# Patient Record
Sex: Male | Born: 1980 | Race: White | Hispanic: No | Marital: Married | Smoking: Never smoker
Health system: Southern US, Community
[De-identification: ages and names within clinical notes are randomized; demographics above are authoritative.]

## PROBLEM LIST (undated history)

## (undated) DIAGNOSIS — K219 Gastro-esophageal reflux disease without esophagitis: Secondary | ICD-10-CM

## (undated) DIAGNOSIS — E039 Hypothyroidism, unspecified: Secondary | ICD-10-CM

## (undated) DIAGNOSIS — E669 Obesity, unspecified: Secondary | ICD-10-CM

## (undated) DIAGNOSIS — T7840XA Allergy, unspecified, initial encounter: Secondary | ICD-10-CM

## (undated) DIAGNOSIS — K760 Fatty (change of) liver, not elsewhere classified: Secondary | ICD-10-CM

## (undated) DIAGNOSIS — E785 Hyperlipidemia, unspecified: Secondary | ICD-10-CM

## (undated) HISTORY — DX: Fatty (change of) liver, not elsewhere classified: K76.0

## (undated) HISTORY — DX: Obesity, unspecified: E66.9

## (undated) HISTORY — DX: Allergy, unspecified, initial encounter: T78.40XA

## (undated) HISTORY — DX: Gastro-esophageal reflux disease without esophagitis: K21.9

## (undated) HISTORY — DX: Hyperlipidemia, unspecified: E78.5

## (undated) HISTORY — PX: WISDOM TOOTH EXTRACTION: SHX21

## (undated) HISTORY — DX: Hypothyroidism, unspecified: E03.9

---

## 1999-05-11 ENCOUNTER — Encounter: Payer: Self-pay | Admitting: Family Medicine

## 2005-05-26 ENCOUNTER — Encounter: Payer: Self-pay | Admitting: Family Medicine

## 2005-05-26 LAB — CONVERTED CEMR LAB: RBC count: 4.69 10*6/uL

## 2005-05-27 ENCOUNTER — Encounter: Payer: Self-pay | Admitting: Family Medicine

## 2005-07-27 ENCOUNTER — Ambulatory Visit: Payer: Self-pay | Admitting: Family Medicine

## 2005-08-25 ENCOUNTER — Ambulatory Visit: Payer: Self-pay | Admitting: Family Medicine

## 2005-08-25 LAB — CONVERTED CEMR LAB: TSH: 25.76 microintl units/mL

## 2005-09-01 ENCOUNTER — Ambulatory Visit: Payer: Self-pay | Admitting: Family Medicine

## 2005-10-26 ENCOUNTER — Ambulatory Visit: Payer: Self-pay | Admitting: Family Medicine

## 2005-10-27 ENCOUNTER — Ambulatory Visit: Payer: Self-pay | Admitting: Family Medicine

## 2005-10-27 LAB — CONVERTED CEMR LAB: TSH: 1.07 microintl units/mL

## 2006-03-02 ENCOUNTER — Ambulatory Visit: Payer: Self-pay | Admitting: Family Medicine

## 2006-06-28 ENCOUNTER — Ambulatory Visit: Payer: Self-pay | Admitting: Family Medicine

## 2006-06-28 LAB — CONVERTED CEMR LAB
Hgb A1c MFr Bld: 5.2 %
TSH: 0.34 microintl units/mL

## 2006-07-01 ENCOUNTER — Ambulatory Visit: Payer: Self-pay | Admitting: Family Medicine

## 2006-11-03 ENCOUNTER — Ambulatory Visit: Payer: Self-pay | Admitting: Family Medicine

## 2006-11-03 LAB — CONVERTED CEMR LAB
Albumin: 3.9 g/dL (ref 3.5–5.2)
Bilirubin, Direct: 0.2 mg/dL (ref 0.0–0.3)
Blood Glucose, Fasting: 102 mg/dL
Cholesterol: 97 mg/dL (ref 0–200)
Creatinine, Ser: 1.1 mg/dL (ref 0.4–1.5)
Free T4: 0.9 ng/dL (ref 0.6–1.6)
Glucose, Bld: 102 mg/dL — ABNORMAL HIGH (ref 70–99)
HDL: 28.5 mg/dL — ABNORMAL LOW (ref >39.0)
LDL Cholesterol: 48 mg/dL (ref 0–99)
Potassium: 3.9 meq/L (ref 3.5–5.1)
Sodium: 142 meq/L (ref 135–145)
TSH: 1.22 microintl units/mL
TSH: 1.22 microintl units/mL (ref 0.35–5.50)
Total Bilirubin: 0.5 mg/dL (ref 0.3–1.2)
Triglycerides: 104 mg/dL (ref 0–149)
VLDL: 21 mg/dL (ref 0–40)

## 2006-11-08 ENCOUNTER — Ambulatory Visit: Payer: Self-pay | Admitting: Family Medicine

## 2006-11-16 ENCOUNTER — Ambulatory Visit: Payer: Self-pay | Admitting: Family Medicine

## 2006-11-22 ENCOUNTER — Ambulatory Visit: Payer: Self-pay | Admitting: Family Medicine

## 2007-05-27 ENCOUNTER — Encounter: Payer: Self-pay | Admitting: Family Medicine

## 2007-05-27 LAB — CONVERTED CEMR LAB: TSH: 150 microintl units/mL

## 2008-02-07 ENCOUNTER — Encounter: Payer: Self-pay | Admitting: Family Medicine

## 2008-02-07 ENCOUNTER — Ambulatory Visit: Payer: Self-pay | Admitting: Family Medicine

## 2008-02-07 DIAGNOSIS — R7303 Prediabetes: Secondary | ICD-10-CM | POA: Insufficient documentation

## 2008-02-07 DIAGNOSIS — E039 Hypothyroidism, unspecified: Secondary | ICD-10-CM | POA: Insufficient documentation

## 2008-02-08 DIAGNOSIS — E785 Hyperlipidemia, unspecified: Secondary | ICD-10-CM | POA: Insufficient documentation

## 2008-02-12 ENCOUNTER — Encounter: Payer: Self-pay | Admitting: Family Medicine

## 2008-05-02 ENCOUNTER — Telehealth: Payer: Self-pay | Admitting: Family Medicine

## 2010-05-12 ENCOUNTER — Encounter (INDEPENDENT_AMBULATORY_CARE_PROVIDER_SITE_OTHER): Payer: Self-pay | Admitting: *Deleted

## 2010-11-03 NOTE — Letter (Signed)
Summary: Nadara Eaton letter  [place] [facility]  [address] [place], [place] [address]   Phone: [phone]  Fax: [phone]       05/12/2010 MRN: [id number]  Antonio Doyle [address] [place], [place]  [address]  Dear Mr. Antonio Doyle,  [facility] [place], and [facility] announce the retirement of Antonio Doyle, M.D., from full-time practice at the [facility] office effective April 02, 2010 and his plans of returning part-time.  It is important to Dr. Hetty Doyle and to our practice that you understand that [facility] has seven physicians in our office for your health care needs.  We will continue to offer the same exceptional care that you have today.    Dr. Hetty Doyle has spoken to many of you about his plans for retirement and returning part-time in the fall.   We will continue to work with you through the transition to schedule appointments for you in the office and meet the high standards that [facility] is committed to.   Again, it is with great pleasure that we share the news that Dr. Hetty Doyle will return to [facility] in October of 2011 with a reduced schedule.    If you have any questions, or would like to request an appointment with one of our physicians, please call us at [phone] and press the option for Scheduling an appointment.  We take pleasure in providing you with excellent patient care and look forward to seeing you at your next office visit.  Our [facility] Physicians are:  Antonio Doyle, M.D. Antonio Doyle, M.D. Antonio Doyle, M.D. Antonio Doyle, M.D. Antonio Doyle, M.D. Antonio Doyle, M.D. We proudly welcomed Antonio Doyle, M.D. and Antonio Doyle, M.D. to the practice in July/August 2011.  Sincerely,  [facility]

## 2012-10-04 DIAGNOSIS — K760 Fatty (change of) liver, not elsewhere classified: Secondary | ICD-10-CM

## 2012-10-04 HISTORY — DX: Fatty (change of) liver, not elsewhere classified: K76.0

## 2012-10-11 ENCOUNTER — Ambulatory Visit (INDEPENDENT_AMBULATORY_CARE_PROVIDER_SITE_OTHER): Payer: BC Managed Care – PPO | Admitting: Family Medicine

## 2012-10-11 ENCOUNTER — Encounter: Payer: Self-pay | Admitting: Family Medicine

## 2012-10-11 VITALS — BP 118/86 | HR 80 | Temp 98.2°F | Ht 70.0 in | Wt 274.8 lb

## 2012-10-11 DIAGNOSIS — E039 Hypothyroidism, unspecified: Secondary | ICD-10-CM

## 2012-10-11 DIAGNOSIS — E669 Obesity, unspecified: Secondary | ICD-10-CM

## 2012-10-11 DIAGNOSIS — Z0000 Encounter for general adult medical examination without abnormal findings: Secondary | ICD-10-CM | POA: Insufficient documentation

## 2012-10-11 MED ORDER — LEVOTHYROXINE SODIUM 125 MCG PO TABS: 125.0000 ug | ORAL_TABLET | Freq: Every day | ORAL | Status: DC

## 2012-10-11 NOTE — Patient Instructions (Addendum)
I've refilled levothyroxine. Return in 1-2 months fasting for lab visit and we will check thyroid function as well as sugar and chol levels. Return in 1 year for next physical or as needed. Work on increasing activity - especially aerobic exercise (cardio).

## 2012-10-11 NOTE — Assessment & Plan Note (Signed)
Refill levothyroxine, 90mo supply per pt preference.

## 2012-10-11 NOTE — Assessment & Plan Note (Signed)
Discussed elevated bmi as well as lifestyle changes to affect weight loss. Wt Readings from Last 3 Encounters:  10/11/12 274 lb 12 oz (124.626 kg)  02/07/08 231 lb (104.781 kg)  07/27/05 232 lb (105.235 kg)   Body mass index is 39.42 kg/(m^2).

## 2012-10-11 NOTE — Progress Notes (Signed)
  Subjective:    Patient ID: Antonio Doyle, male    DOB: 11-Nov-1980, 32 y.o.   MRN: [id number]  HPI CC: new pt to re establish  Prior saw Dr. Glenis Smoker, not satisfied with wait time.  Hypothyroid - takes synthroid generic.  Ran out a few days ago.    Caffeine: mt dew Lives with wife and 2 daughters, 2 dogs Occupation: Psychiatrist Edu: college Activity: trying to go to fitness center a few days a week Diet: good water, fruits/vegetables daily  Preventative: No recent CPE.  Last blood work was 1 year ago. Td - a few years since last one. Declines flu today.  Seat belt use discussed. Sunscreen use discussed.  Review of Systems  Constitutional: Negative for fever, chills, activity change, appetite change, fatigue and unexpected weight change.  HENT: Negative for hearing loss and neck pain.   Eyes: Negative for visual disturbance.  Respiratory: Negative for cough, chest tightness, shortness of breath and wheezing.   Cardiovascular: Negative for chest pain, palpitations and leg swelling.  Gastrointestinal: Negative for nausea, vomiting, abdominal pain, diarrhea, constipation, blood in stool and abdominal distention.  Genitourinary: Negative for hematuria and difficulty urinating.  Musculoskeletal: Negative for myalgias and arthralgias.  Skin: Negative for rash.  Neurological: Positive for headaches (sinus). Negative for dizziness, seizures and syncope.  Hematological: Does not bruise/bleed easily.  Psychiatric/Behavioral: Negative for dysphoric mood. The patient is not nervous/anxious.        Objective:   Physical Exam  Nursing note and vitals reviewed. Constitutional: He is oriented to person, place, and time. He appears well-developed and well-nourished. No distress.  HENT:  Head: Normocephalic and atraumatic.  Right Ear: External ear normal.  Left Ear: External ear normal.  Nose: Nose normal.  Mouth/Throat: Oropharynx is clear and moist. No  oropharyngeal exudate.  Eyes: Conjunctivae normal and EOM are normal. Pupils are equal, round, and reactive to light. No scleral icterus.  Neck: Normal range of motion. Neck supple. No thyromegaly present.  Cardiovascular: Normal rate, regular rhythm, normal heart sounds and intact distal pulses.   No murmur heard. Pulses:      Radial pulses are 2+ on the right side, and 2+ on the left side.  Pulmonary/Chest: Effort normal and breath sounds normal. No respiratory distress. He has no wheezes. He has no rales.  Abdominal: Soft. Bowel sounds are normal. He exhibits no distension and no mass. There is no tenderness. There is no rebound and no guarding.  Musculoskeletal: Normal range of motion. He exhibits no edema.  Lymphadenopathy:    He has no cervical adenopathy.  Neurological: He is alert and oriented to person, place, and time.       CN grossly intact, station and gait intact  Skin: Skin is warm and dry. No rash noted.  Psychiatric: He has a normal mood and affect. His behavior is normal. Judgment and thought content normal.       Assessment & Plan:

## 2012-10-11 NOTE — Assessment & Plan Note (Signed)
Preventative protocols reviewed and updated unless pt declined. Discussed healthy diet and lifestyle. We don't have egg-free flu shot.

## 2012-11-10 ENCOUNTER — Other Ambulatory Visit (INDEPENDENT_AMBULATORY_CARE_PROVIDER_SITE_OTHER): Payer: BC Managed Care – PPO

## 2012-11-10 DIAGNOSIS — Z0000 Encounter for general adult medical examination without abnormal findings: Secondary | ICD-10-CM

## 2012-11-10 DIAGNOSIS — E039 Hypothyroidism, unspecified: Secondary | ICD-10-CM

## 2012-11-10 DIAGNOSIS — E669 Obesity, unspecified: Secondary | ICD-10-CM

## 2012-11-10 LAB — COMPREHENSIVE METABOLIC PANEL
Albumin: 4.5 g/dL (ref 3.5–5.2)
CO2: 28 mEq/L (ref 19–32)
Calcium: 9 mg/dL (ref 8.4–10.5)
GFR: 81.43 mL/min (ref >60.00)
Glucose, Bld: 108 mg/dL — ABNORMAL HIGH (ref 70–99)
Potassium: 3.9 mEq/L (ref 3.5–5.1)
Sodium: 141 mEq/L (ref 135–145)
Total Protein: 7.5 g/dL (ref 6.0–8.3)

## 2012-11-10 LAB — TSH: TSH: 6.63 u[IU]/mL — ABNORMAL HIGH (ref 0.35–5.50)

## 2012-11-10 LAB — LIPID PANEL: Cholesterol: 109 mg/dL (ref 0–200)

## 2012-11-12 ENCOUNTER — Encounter: Payer: Self-pay | Admitting: Family Medicine

## 2012-11-12 ENCOUNTER — Other Ambulatory Visit: Payer: Self-pay | Admitting: Family Medicine

## 2012-11-12 DIAGNOSIS — R7401 Elevation of levels of liver transaminase levels: Secondary | ICD-10-CM

## 2012-11-12 DIAGNOSIS — R739 Hyperglycemia, unspecified: Secondary | ICD-10-CM | POA: Insufficient documentation

## 2012-11-12 DIAGNOSIS — R7309 Other abnormal glucose: Secondary | ICD-10-CM

## 2012-11-12 DIAGNOSIS — E039 Hypothyroidism, unspecified: Secondary | ICD-10-CM

## 2012-11-12 DIAGNOSIS — K7581 Nonalcoholic steatohepatitis (NASH): Secondary | ICD-10-CM | POA: Insufficient documentation

## 2012-11-12 MED ORDER — LEVOTHYROXINE SODIUM 137 MCG PO TABS: 137.0000 ug | ORAL_TABLET | Freq: Every day | ORAL | Status: DC

## 2013-01-16 ENCOUNTER — Other Ambulatory Visit (INDEPENDENT_AMBULATORY_CARE_PROVIDER_SITE_OTHER): Payer: BC Managed Care – PPO

## 2013-01-16 DIAGNOSIS — R7309 Other abnormal glucose: Secondary | ICD-10-CM

## 2013-01-16 DIAGNOSIS — E786 Lipoprotein deficiency: Secondary | ICD-10-CM

## 2013-01-16 DIAGNOSIS — R7402 Elevation of levels of lactic acid dehydrogenase (LDH): Secondary | ICD-10-CM

## 2013-01-16 DIAGNOSIS — E039 Hypothyroidism, unspecified: Secondary | ICD-10-CM

## 2013-01-16 DIAGNOSIS — R7401 Elevation of levels of liver transaminase levels: Secondary | ICD-10-CM

## 2013-01-16 LAB — HEPATIC FUNCTION PANEL
ALT: 105 U/L — ABNORMAL HIGH (ref 0–53)
AST: 40 U/L — ABNORMAL HIGH (ref 0–37)
Albumin: 4.3 g/dL (ref 3.5–5.2)
Alkaline Phosphatase: 73 U/L (ref 39–117)
Bilirubin, Direct: 0.1 mg/dL (ref 0.0–0.3)
Total Protein: 7.2 g/dL (ref 6.0–8.3)

## 2013-01-16 LAB — TSH: TSH: 0.37 u[IU]/mL (ref 0.35–5.50)

## 2013-01-22 ENCOUNTER — Other Ambulatory Visit: Payer: Self-pay | Admitting: Family Medicine

## 2013-01-22 DIAGNOSIS — R7401 Elevation of levels of liver transaminase levels: Secondary | ICD-10-CM

## 2013-01-31 ENCOUNTER — Encounter: Payer: Self-pay | Admitting: Family Medicine

## 2013-01-31 ENCOUNTER — Ambulatory Visit: Payer: Self-pay | Admitting: Family Medicine

## 2013-02-04 ENCOUNTER — Other Ambulatory Visit: Payer: Self-pay | Admitting: Family Medicine

## 2013-02-04 DIAGNOSIS — R162 Hepatomegaly with splenomegaly, not elsewhere classified: Secondary | ICD-10-CM

## 2013-02-04 DIAGNOSIS — E039 Hypothyroidism, unspecified: Secondary | ICD-10-CM

## 2013-02-04 DIAGNOSIS — R7401 Elevation of levels of liver transaminase levels: Secondary | ICD-10-CM

## 2013-02-06 ENCOUNTER — Telehealth: Payer: Self-pay

## 2013-02-06 DIAGNOSIS — R7401 Elevation of levels of liver transaminase levels: Secondary | ICD-10-CM

## 2013-02-06 NOTE — Telephone Encounter (Signed)
Ok to refer to GI per patient request - I've placed referral in chart. However, I'd like him to come in for further blood work evaluation prior to GI appointment - may come in this week.  Orders in chart.

## 2013-02-06 NOTE — Telephone Encounter (Signed)
Message left advising patient to call and schedule lab appt and that referral would be made.

## 2013-02-06 NOTE — Telephone Encounter (Signed)
Pt s wife left v/m pt is requesting referral to [facility] [phone] due to LFT elevated and enlarged liver; pt does not want to wait for further testing.Please advise.

## 2013-02-07 ENCOUNTER — Encounter: Payer: Self-pay | Admitting: Internal Medicine

## 2013-02-07 ENCOUNTER — Other Ambulatory Visit (INDEPENDENT_AMBULATORY_CARE_PROVIDER_SITE_OTHER): Payer: BC Managed Care – PPO

## 2013-02-07 DIAGNOSIS — R7402 Elevation of levels of lactic acid dehydrogenase (LDH): Secondary | ICD-10-CM

## 2013-02-07 DIAGNOSIS — E039 Hypothyroidism, unspecified: Secondary | ICD-10-CM

## 2013-02-07 DIAGNOSIS — R162 Hepatomegaly with splenomegaly, not elsewhere classified: Secondary | ICD-10-CM

## 2013-02-07 DIAGNOSIS — K7689 Other specified diseases of liver: Secondary | ICD-10-CM

## 2013-02-07 DIAGNOSIS — R7401 Elevation of levels of liver transaminase levels: Secondary | ICD-10-CM

## 2013-02-07 LAB — PROTIME-INR
INR: 1.1 ratio — ABNORMAL HIGH (ref 0.8–1.0)
Prothrombin Time: 11.2 s (ref 10.2–12.4)

## 2013-02-07 LAB — GAMMA GT: GGT: 66 U/L — ABNORMAL HIGH (ref 7–51)

## 2013-02-07 LAB — TSH: TSH: 0.78 u[IU]/mL (ref 0.35–5.50)

## 2013-02-07 NOTE — Telephone Encounter (Signed)
Patient changed his mind and wanted to go to [facility] instead of [facility].

## 2013-02-08 LAB — ANA: Anti Nuclear Antibody(ANA): NEGATIVE

## 2013-02-09 LAB — PROTEIN ELECTROPHORESIS, SERUM, WITH REFLEX
Alpha-2-Globulin: 9.3 % (ref 7.1–11.8)
Gamma Globulin: 17.1 % (ref 11.1–18.8)
Total Protein, Serum Electrophoresis: 7.1 g/dL (ref 6.0–8.3)

## 2013-02-09 LAB — CERULOPLASMIN: Ceruloplasmin: 24 mg/dL (ref 20–60)

## 2013-03-01 ENCOUNTER — Encounter: Payer: Self-pay | Admitting: Internal Medicine

## 2013-03-05 ENCOUNTER — Ambulatory Visit (INDEPENDENT_AMBULATORY_CARE_PROVIDER_SITE_OTHER): Payer: BC Managed Care – PPO | Admitting: Internal Medicine

## 2013-03-05 ENCOUNTER — Encounter: Payer: Self-pay | Admitting: Internal Medicine

## 2013-03-05 ENCOUNTER — Other Ambulatory Visit (INDEPENDENT_AMBULATORY_CARE_PROVIDER_SITE_OTHER): Payer: BC Managed Care – PPO

## 2013-03-05 VITALS — BP 118/80 | HR 80 | Ht 70.0 in | Wt 266.0 lb

## 2013-03-05 DIAGNOSIS — K625 Hemorrhage of anus and rectum: Secondary | ICD-10-CM

## 2013-03-05 DIAGNOSIS — K7689 Other specified diseases of liver: Secondary | ICD-10-CM

## 2013-03-05 DIAGNOSIS — R162 Hepatomegaly with splenomegaly, not elsewhere classified: Secondary | ICD-10-CM

## 2013-03-05 DIAGNOSIS — R61 Generalized hyperhidrosis: Secondary | ICD-10-CM

## 2013-03-05 DIAGNOSIS — R7989 Other specified abnormal findings of blood chemistry: Secondary | ICD-10-CM

## 2013-03-05 LAB — HEPATIC FUNCTION PANEL
ALT: 83 U/L — ABNORMAL HIGH (ref 0–53)
AST: 33 U/L (ref 0–37)
Alkaline Phosphatase: 77 U/L (ref 39–117)
Bilirubin, Direct: 0.1 mg/dL (ref 0.0–0.3)
Total Bilirubin: 0.8 mg/dL (ref 0.3–1.2)

## 2013-03-05 LAB — IGA: IgA: 120 mg/dL (ref 68–378)

## 2013-03-05 LAB — HEPATITIS A ANTIBODY, TOTAL: Hep A Total Ab: NEGATIVE

## 2013-03-05 NOTE — Progress Notes (Signed)
Patient ID: Antonio Doyle, male   DOB: Sep 27, 1981, 32 y.o.   MRN: [id number] HPI: Antonio Doyle is a 32 yo male with PMH of hypothyroidism, obesity, hyperglycemia without diagnosis of diabetes who is seen in consultation at request of Dr. Sharen Hones for evaluation of elevated liver function tests and hepatosplenomegaly by ultrasound. He is here today with his wife. The patient denies any family history or personal history of liver disease. He denies jaundice, ascites, significant lower extremity edema, bleeding or itching. He does have mild/trace ankle edema at the end of the day which resolves by morning. He denies abdominal pain, heartburn, nausea or vomiting. He reports a good appetite. He reports significantly low her energy levels over the last 4-6 months. His wife feels that he "gets sick easily" and has had multiple "colds" over the last 6 months. He reports poor sleep as well as intermittent night sweats and possible fevers. He has not measured his temperatures at home. He denies rashes. No known tick bites. He does report scant red blood with wiping and occasional loose stool over the last "1-2 months". This is painless in nature and small volume. He denies diarrhea or constipation. He does not drink alcohol. No illicit drug use. No over-the-counter supplements or medications  He reports his paternal grandfather had alcoholic cirrhosis and also colon cancer. This was an advanced age but he is unsure what age.  Patient Active Problem List   Diagnosis Date Noted  . Transaminitis 11/12/2012  . Healthcare maintenance 10/11/2012  . Obesity   . LOW HDL 02/08/2008  . HYPOTHYROIDISM 02/07/2008  . HYPERGLYCEMIA 02/07/2008    Past Surgical History  Procedure Laterality Date  . Wisdom tooth extraction      x 3    Current Outpatient Prescriptions  Medication Sig Dispense Refill  . levothyroxine (SYNTHROID, LEVOTHROID) 137 MCG tablet Take 1 tablet (137 mcg total) by mouth daily.  90 tablet  3    No current facility-administered medications for this visit.    Allergies  Allergen Reactions  . Eggs Or Egg-Derived Products Anaphylaxis    Family History  Problem Relation Age of Onset  . Adopted: Yes  . Pancreatic cancer Maternal Grandfather   . Colon cancer Paternal Grandfather     colon  . Stroke Father     smoker  . Diabetes Neg Hx   . Hypertension Neg Hx   . CAD Neg Hx   . Colon polyps Neg Hx     History  Substance Use Topics  . Smoking status: Never Smoker   . Smokeless tobacco: Never Used  . Alcohol Use: No    ROS: As per history of present illness, otherwise negative  BP 118/80  Pulse 80  Ht 5\' 10"  (1.778 m)  Wt 266 lb (120.657 kg)  BMI 38.17 kg/m2 Constitutional: Well-developed and well-nourished. No distress. HEENT: Normocephalic and atraumatic. Oropharynx is clear and moist. No oropharyngeal exudate. Conjunctivae are normal.  No scleral icterus. Neck: Neck supple. Trachea midline. Cardiovascular: Normal rate, regular rhythm and intact distal pulses. No M/R/G Pulmonary/chest: Effort normal and breath sounds normal. No wheezing, rales or rhonchi. Abdominal: Soft, nontender, nondistended. Bowel sounds active throughout. There are no masses palpable. No hepatosplenomegaly. Extremities: no clubbing, cyanosis, or edema Lymphadenopathy: No cervical adenopathy noted. Neurological: Alert and oriented to person place and time. Skin: Skin is warm and dry. No rashes noted. Psychiatric: Normal mood and affect. Behavior is normal.  RELEVANT LABS AND IMAGING:  CMP     Component Value  Date/Time   NA 141 11/10/2012 0855   K 3.9 11/10/2012 0855   CL 108 11/10/2012 0855   CO2 28 11/10/2012 0855   GLUCOSE 108* 11/10/2012 0855   BUN 13 11/10/2012 0855   CREATININE 1.1 11/10/2012 0855   CALCIUM 9.0 11/10/2012 0855   PROT 7.2 01/16/2013 0900   ALBUMIN 4.3 01/16/2013 0900   AST 40* 01/16/2013 0900   ALT 105* 01/16/2013 0900   ALKPHOS 73 01/16/2013 0900   BILITOT 0.4 01/16/2013  0900   GFRNONAA 85 11/03/2006 1010   GFRAA 103 11/03/2006 1010   Iron/TIBC/Ferritin    Component Value Date/Time   IRON 61 01/16/2013 0900   ANA - neg ASMA - neg Ceruloplasmin - normal TSH - normal Acute Hep panel neg Liver kidney microsome - neg  Complete abd [place],  study reviewed,  - 01/31/13 - mild hepatosplenomegaly otherwise unremarkable  ASSESSMENT/PLAN: 32 yo male with PMH of hypothyroidism, obesity, hyperglycemia without diagnosis of diabetes who is seen in consultation at request of Dr. Sharen Hones for evaluation of elevated liver function tests and hepatosplenomegaly by ultrasound, also with intermittent rectal bleeding and B symptoms including fatigue, night sweats, and possible fever  1.  Elevated serum transaminases/mild hepatosplenomegaly/B symptoms -- the laboratory evaluation to date has showed no reason for elevated serum transaminases. He would be at risk for nonalcoholic fatty liver disease, but ultrasound showed no definite steatosis. At this point given his B. symptoms and mild hepatosplenomegaly ultrasound, I would like to perform cross-sectional imaging of the abdomen and pelvis. MRI with contrast will be ordered. I would also like to perform additional labs to include ANA, hepatitis B. surface antibody and core antibody total, hep A total antibody, alpha-1 antitrypsin and a celiac panel. I will repeat hepatic function panel today. I asked that he measure his temperature when he feels that he has a fever and keep these recorded. We did briefly discuss liver biopsy, but I would like to perform the previously mentioned tests 1st.  I will see him back in 6 weeks, sooner if necessary  2.  Rectal bleeding -- I recommended flexible sigmoidoscopy for further characterization and diagnosis of his rectal bleeding. He would like to think about this, and we will discuss this in followup.

## 2013-03-05 NOTE — Patient Instructions (Addendum)
Your physician has requested that you go to the basement for lab work before leaving today.  You have been scheduled for an MRI at [facility] on 03/12/2013. Your appointment time is 12:00pm. Please arrive 15 minutes prior to your appointment time for registration purposes. There is no prep for this test. However, if you have any metal in your body, have a pacemaker or defibrillator, please be sure to let your ordering physician know. This test typically takes 45 minutes to 1 hour to complete. Nothing to eat or drink 4 hours prior to scan.  Dr. Rhea Belton recommends you get a flexible sigmoidoscopy, when you choose to have this procedure please call us at [phone]                                               We are excited to introduce MyChart, a new best-in-class service that provides you online access to important information in your electronic medical record. We want to make it easier for you to view your health information - all in one secure location - when and where you need it. We expect MyChart will enhance the quality of care and service we provide.  When you register for MyChart, you can:    View your test results.    Request appointments and receive appointment reminders via email.    Request medication renewals.    View your medical history, allergies, medications and immunizations.    Communicate with your physician's office through a password-protected site.    Conveniently print information such as your medication lists.  To find out if MyChart is right for you, please talk to a member of our clinical staff today. We will gladly answer your questions about this free health and wellness tool.  If you are age 34 or older and want a member of your family to have access to your record, you must provide written consent by completing a proxy form available at our office. Please speak to our clinical staff about guidelines regarding accounts for patients younger than age  34.  As you activate your MyChart account and need any technical assistance, please call the MyChart technical support line at [phone]) [phone]-CHART [phone]) or email your question to [email or web address]. If you email your question(s), please include your name, a return phone number and the best time to reach you.  If you have non-urgent health-related questions, you can send a message to our office through MyChart at [email or web address]. If you have a medical emergency, call 911.  Thank you for using MyChart as your new health and wellness resource!   MyChart licensed from [employer],  [id number]. Patents Pending.

## 2013-03-06 LAB — ANTI-SMOOTH MUSCLE ANTIBODY, IGG: Smooth Muscle Ab: 9 U

## 2013-03-06 LAB — TISSUE TRANSGLUTAMINASE, IGA: Tissue Transglutaminase Ab, IgA: 3 U/mL

## 2013-03-12 ENCOUNTER — Other Ambulatory Visit: Payer: Self-pay | Admitting: Internal Medicine

## 2013-03-12 ENCOUNTER — Ambulatory Visit (HOSPITAL_COMMUNITY): Payer: BC Managed Care – PPO

## 2013-03-12 ENCOUNTER — Ambulatory Visit (HOSPITAL_COMMUNITY)
Admission: RE | Admit: 2013-03-12 | Discharge: 2013-03-12 | Disposition: A | Discharge status: Home / Self Care | Payer: BC Managed Care – PPO | Source: Ambulatory Visit | Attending: Internal Medicine | Admitting: Internal Medicine

## 2013-03-12 ENCOUNTER — Ambulatory Visit (HOSPITAL_COMMUNITY): Admission: RE | Admit: 2013-03-12 | Payer: BC Managed Care – PPO | Source: Ambulatory Visit

## 2013-03-12 ENCOUNTER — Telehealth: Payer: Self-pay | Admitting: Internal Medicine

## 2013-03-12 DIAGNOSIS — R162 Hepatomegaly with splenomegaly, not elsewhere classified: Secondary | ICD-10-CM

## 2013-03-12 DIAGNOSIS — R7989 Other specified abnormal findings of blood chemistry: Secondary | ICD-10-CM

## 2013-03-12 DIAGNOSIS — R61 Generalized hyperhidrosis: Secondary | ICD-10-CM

## 2013-03-12 DIAGNOSIS — R161 Splenomegaly, not elsewhere classified: Secondary | ICD-10-CM

## 2013-03-12 DIAGNOSIS — R160 Hepatomegaly, not elsewhere classified: Secondary | ICD-10-CM

## 2013-03-12 MED ORDER — GADOBENATE DIMEGLUMINE 529 MG/ML IV SOLN
20.0000 mL | Freq: Once | INTRAVENOUS | Status: AC | PRN
  Administered 2013-03-12: 20 mL via INTRAVENOUS

## 2013-03-13 NOTE — Telephone Encounter (Signed)
Spoke with wife who though it best to have an Open MRI. Informed her of appt at GI at [address] on 03/22/13, arrive 2:30PM and NPO  4 hours prior. Wife stated understanding.

## 2013-03-22 ENCOUNTER — Ambulatory Visit
Admission: RE | Admit: 2013-03-22 | Discharge: 2013-03-22 | Disposition: A | Discharge status: Home / Self Care | Payer: BC Managed Care – PPO | Source: Ambulatory Visit | Attending: Internal Medicine | Admitting: Internal Medicine

## 2013-03-22 DIAGNOSIS — R7989 Other specified abnormal findings of blood chemistry: Secondary | ICD-10-CM

## 2013-03-22 DIAGNOSIS — R160 Hepatomegaly, not elsewhere classified: Secondary | ICD-10-CM

## 2013-03-22 DIAGNOSIS — R161 Splenomegaly, not elsewhere classified: Secondary | ICD-10-CM

## 2013-03-22 MED ORDER — GADOBENATE DIMEGLUMINE 529 MG/ML IV SOLN
20.0000 mL | Freq: Once | INTRAVENOUS | Status: AC | PRN
  Administered 2013-03-22: 20 mL via INTRAVENOUS

## 2013-03-27 ENCOUNTER — Telehealth: Payer: Self-pay | Admitting: Internal Medicine

## 2013-03-27 NOTE — Telephone Encounter (Signed)
Notes Recorded by Beverley Fiedler, MD on 03/26/2013 at 4:38 PM MRI shows diffuse fatty liver but no focal liver masses or lesions Spleen is mildly enlarged No other abnormalities, masses, lymphadenopathy, etc. All in all this is a helpful report, and fatty liver disease is felt to explain his elevated liver enzymes He should followup in clinic as previously discussed   Informed pt of results; he stated understanding.

## 2013-04-08 ENCOUNTER — Other Ambulatory Visit: Payer: Self-pay | Admitting: Family Medicine

## 2013-04-08 ENCOUNTER — Encounter: Payer: Self-pay | Admitting: Family Medicine

## 2013-04-08 DIAGNOSIS — K760 Fatty (change of) liver, not elsewhere classified: Secondary | ICD-10-CM

## 2013-04-09 ENCOUNTER — Other Ambulatory Visit: Payer: BC Managed Care – PPO

## 2013-04-16 ENCOUNTER — Ambulatory Visit: Payer: BC Managed Care – PPO | Admitting: Family Medicine

## 2013-04-19 ENCOUNTER — Telehealth: Payer: Self-pay

## 2013-04-19 MED ORDER — SCOPOLAMINE 1 MG/3DAYS TD PT72: 1.0000 | MEDICATED_PATCH | TRANSDERMAL | Status: DC

## 2013-04-19 NOTE — Telephone Encounter (Signed)
plz notify sent in. 

## 2013-04-19 NOTE — Telephone Encounter (Signed)
pts wife left v/m requesting sea sick patches to [employer]; pt going on cruise for 5  days.Please advise.

## 2013-04-19 NOTE — Telephone Encounter (Signed)
Message left notifying patient.

## 2013-12-07 ENCOUNTER — Other Ambulatory Visit: Payer: Self-pay | Admitting: Family Medicine

## 2013-12-07 NOTE — Telephone Encounter (Signed)
Pt has not seen you since 10/2012.  No future appts.

## 2014-01-11 ENCOUNTER — Ambulatory Visit (INDEPENDENT_AMBULATORY_CARE_PROVIDER_SITE_OTHER): Payer: Managed Care, Other (non HMO) | Admitting: Family Medicine

## 2014-01-11 ENCOUNTER — Encounter: Payer: Self-pay | Admitting: Family Medicine

## 2014-01-11 VITALS — BP 118/86 | HR 80 | Temp 98.0°F | Wt 264.0 lb

## 2014-01-11 DIAGNOSIS — E669 Obesity, unspecified: Secondary | ICD-10-CM

## 2014-01-11 DIAGNOSIS — E786 Lipoprotein deficiency: Secondary | ICD-10-CM

## 2014-01-11 DIAGNOSIS — E039 Hypothyroidism, unspecified: Secondary | ICD-10-CM

## 2014-01-11 DIAGNOSIS — K760 Fatty (change of) liver, not elsewhere classified: Secondary | ICD-10-CM

## 2014-01-11 DIAGNOSIS — K7689 Other specified diseases of liver: Secondary | ICD-10-CM

## 2014-01-11 NOTE — Assessment & Plan Note (Signed)
Body mass index is 37.88 kg/(m^2).  Encouraged increased exercise once he finishes school in July.

## 2014-01-11 NOTE — Assessment & Plan Note (Signed)
Encouraged increased aerobic exercise 

## 2014-01-11 NOTE — Progress Notes (Signed)
Pre visit review using our clinic review tool, if applicable. No additional management support is needed unless otherwise documented below in the visit note. 

## 2014-01-11 NOTE — Addendum Note (Signed)
Addended by: Ellamae Sia on: 01/11/2014 09:32 AM   Modules accepted: Orders

## 2014-01-11 NOTE — Assessment & Plan Note (Signed)
Check TSH today, then continue or titrate levothyroxine accordingly.

## 2014-01-11 NOTE — Addendum Note (Signed)
Addended by: Marchia Bond on: 01/11/2014 09:19 AM   Modules accepted: Orders

## 2014-01-11 NOTE — Assessment & Plan Note (Signed)
S/p GI evaluation with stable MRI. Check LFTs today.

## 2014-01-11 NOTE — Patient Instructions (Signed)
Blood work today - we will call you with results and plan regarding thyroid. I will also recheck cholesterol and liver today. Once school ends, restart regular exercise Return as needed or in 1 year for physical.

## 2014-01-11 NOTE — Progress Notes (Signed)
BP 118/86  Pulse 80  Temp(Src) 98 F (36.7 C) (Oral)  Wt 264 lb (119.75 kg)  SpO2 96%   CC: med refill visit  Subjective:    Patient ID: Antonio Doyle, male    DOB: Feb 14, 1981, 33 y.o.   MRN: [id number]  HPI: Antonio Doyle is a 33 y.o. male presenting on 01/11/2014 for Medication Refill   Hypothyroid - compliant with synthroid daily.  No denies changes in weight, appetite changes, skin or hair changes, diarrhea/constipation, heat or cold intolerance.  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  Did not f/u with GI.  No rectal bleeding..    Obesity - no regular exercise.  More busy with finishing school work - to end July 2015.  Planning on exercise.  Wt Readings from Last 3 Encounters:  01/11/14 264 lb (119.75 kg)  03/05/13 266 lb (120.657 kg)  10/11/12 274 lb 12 oz (124.626 kg)   Body mass index is 37.88 kg/(m^2).  MRI ABDOMEN WITH AND WITHOUT CONTRAST (2014) TECHNIQUE:  Multiplanar multisequence MR imaging of the abdomen was performed  both before and after administration of intravenous contrast.  CONTRAST: 20mL MULTIHANCE GADOBENATE DIMEGLUMINE 529 MG/ML IV SOLN  COMPARISON: None.  FINDINGS:  Diffuse hepatic steatosis demonstrated on chemical shift imaging,  however no liver masses are identified. No gross morphologic changes  of cirrhosis demonstrated. No evidence of biliary ductal dilatation.  Gallbladder is unremarkable.  The spleen is at the upper limit of normal in size, measuring  approximately 13 cm in length. No splenic masses are seen. No  evidence of ascites.  The pancreas and adrenal glands are normal in appearance. A tiny  simple cyst seen in the upper pole of the left kidney. Kidneys are  otherwise normal in appearance, without evidence of mass or  hydronephrosis.  No evidence of abdominal lymphadenopathy or other soft tissue  masses. No evidence of inflammatory process or abnormal fluid  collections.  IMPRESSION:    1. Diffuse hepatic steatosis.  2. No evidence of hepatic neoplasm or gross morphologic signs of  cirrhosis.  3. Borderline splenomegaly with spleen measuring approximately 13  cm. No splenic lesions identified.  Electronically Signed  By: Myles Rosenthal  On: 03/22/2013 16:15  Relevant past medical, surgical, family and social history reviewed and updated as indicated.  Allergies and medications reviewed and updated. Current Outpatient Prescriptions on File Prior to Visit  Medication Sig  . levothyroxine (SYNTHROID, LEVOTHROID) 137 MCG tablet Take one tablet by mouth one time daily   No current facility-administered medications on file prior to visit.    Review of Systems Per HPI unless specifically indicated above    Objective:    BP 118/86  Pulse 80  Temp(Src) 98 F (36.7 C) (Oral)  Wt 264 lb (119.75 kg)  SpO2 96%  Physical Exam  Nursing note and vitals reviewed. Constitutional: He appears well-developed and well-nourished. No distress.  HENT:  Mouth/Throat: Oropharynx is clear and moist. No oropharyngeal exudate.  Neck: No thyromegaly present.  Cardiovascular: Normal rate, regular rhythm, normal heart sounds and intact distal pulses.   No murmur heard. Pulmonary/Chest: Effort normal and breath sounds normal. No respiratory distress. He has no wheezes. He has no rales.  Abdominal: Soft. Bowel sounds are normal. He exhibits no distension and no mass. There is no hepatosplenomegaly. There is no tenderness. There is no rebound and no guarding.  Musculoskeletal: He exhibits no edema.  Assessment & Plan:   Problem List Items Addressed This Visit   HYPOTHYROIDISM - Primary     Check TSH today, then continue or titrate levothyroxine accordingly.    Relevant Orders      TSH   LOW HDL     Encouraged increased aerobic exercise.    Obesity     Body mass index is 37.88 kg/(m^2).  Encouraged increased exercise once he finishes school in July.    Hepatic steatosis      S/p GI evaluation with stable MRI. Check LFTs today.    Relevant Orders      Lipid panel      Comprehensive metabolic panel       Follow up plan: Return in about 1 year (around 01/12/2015), or as needed, for annual exam, prior fasting for blood work.

## 2014-01-12 ENCOUNTER — Encounter: Payer: Self-pay | Admitting: Family Medicine

## 2014-01-12 LAB — COMPREHENSIVE METABOLIC PANEL
ALBUMIN: 4.6 g/dL (ref 3.5–5.5)
ALK PHOS: 100 IU/L (ref 39–117)
ALT: 75 IU/L — ABNORMAL HIGH (ref 0–44)
AST: 32 IU/L (ref 0–40)
Albumin/Globulin Ratio: 1.9 (ref 1.1–2.5)
BUN / CREAT RATIO: 11 (ref 8–19)
BUN: 11 mg/dL (ref 6–20)
CALCIUM: 9.5 mg/dL (ref 8.7–10.2)
CHLORIDE: 104 mmol/L (ref 97–108)
CO2: 24 mmol/L (ref 18–29)
Creatinine, Ser: 1.02 mg/dL (ref 0.76–1.27)
GFR calc Af Amer: 110 mL/min/{1.73_m2} (ref >59)
GFR calc non Af Amer: 95 mL/min/{1.73_m2} (ref >59)
Globulin, Total: 2.4 g/dL (ref 1.5–4.5)
Glucose: 109 mg/dL — ABNORMAL HIGH (ref 65–99)
Potassium: 4.9 mmol/L (ref 3.5–5.2)
SODIUM: 144 mmol/L (ref 134–144)
Total Bilirubin: 0.4 mg/dL (ref 0.0–1.2)
Total Protein: 7 g/dL (ref 6.0–8.5)

## 2014-01-12 LAB — LIPID PANEL
CHOL/HDL RATIO: 4.2 ratio (ref 0.0–5.0)
CHOLESTEROL TOTAL: 114 mg/dL (ref 100–199)
HDL: 27 mg/dL — ABNORMAL LOW (ref >39)
LDL CALC: 49 mg/dL (ref 0–99)
Triglycerides: 192 mg/dL — ABNORMAL HIGH (ref 0–149)
VLDL CHOLESTEROL CAL: 38 mg/dL (ref 5–40)

## 2014-01-12 LAB — TSH: TSH: 0.468 u[IU]/mL (ref 0.450–4.500)

## 2014-01-14 ENCOUNTER — Encounter: Payer: Self-pay | Admitting: *Deleted

## 2014-02-12 ENCOUNTER — Other Ambulatory Visit: Payer: Self-pay | Admitting: *Deleted

## 2014-02-12 MED ORDER — LEVOTHYROXINE SODIUM 137 MCG PO TABS: ORAL_TABLET | ORAL | Status: DC

## 2014-06-24 IMAGING — US ABDOMEN ULTRASOUND
1 series · 14 of 25 positions shown · non-contrast
Comparison: none

REASON FOR EXAM: elevated transaminases
COMMENTS:

PROCEDURE:     US  - US ABDOMEN GENERAL SURVEY  - January 31, 2013  [DATE]
RESULT:     Comparison: None
TECHNIQUE: Multiple gray-scale and color-flow Doppler images of the abdomen
are presented for review.

[Series 1: abdomen ultrasound · 0.42mm/px · 14 of 99 slices shown]
[im 1/99]
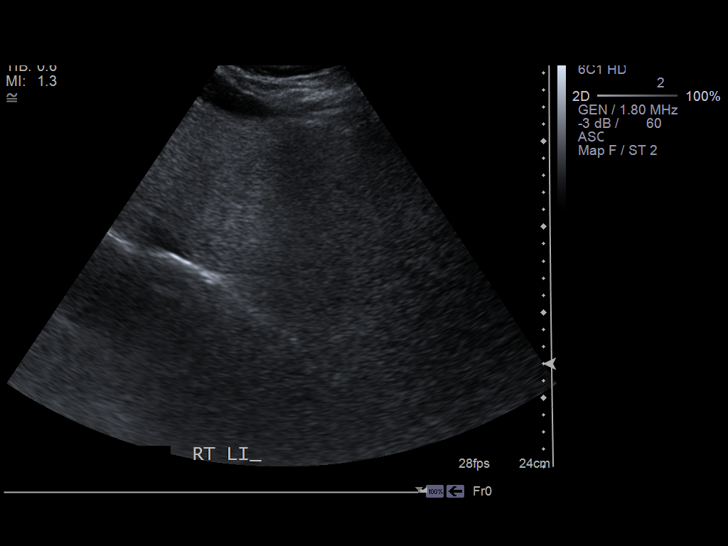
[im 9/99]
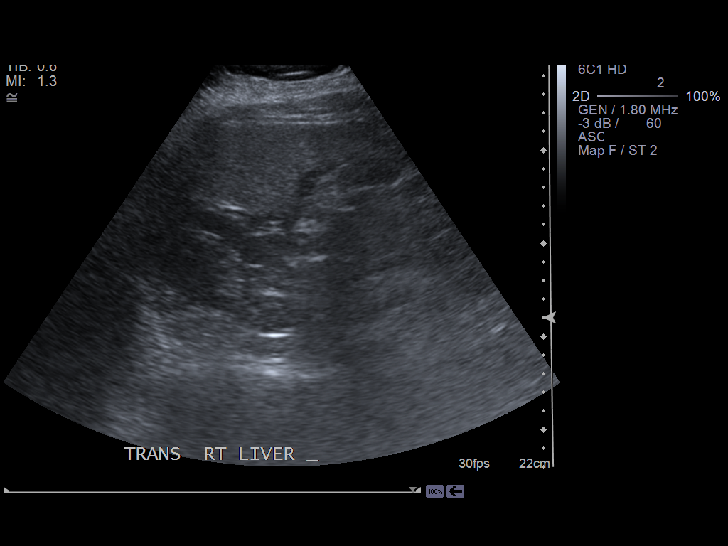
[im 17/99]
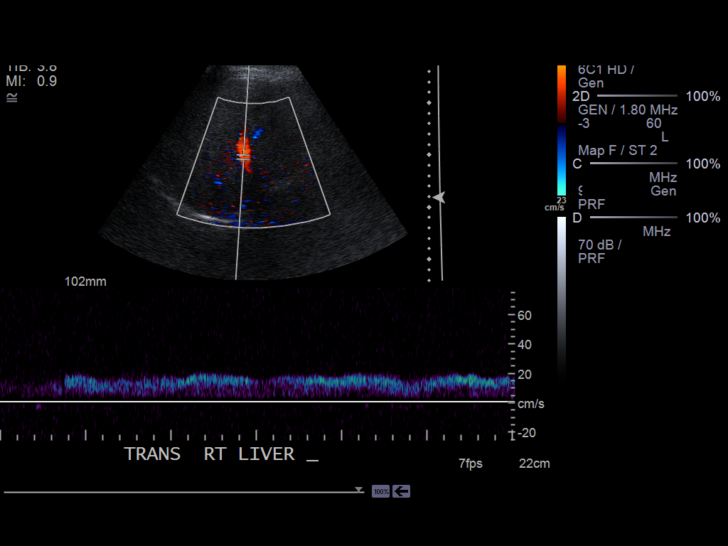
[im 25/99]
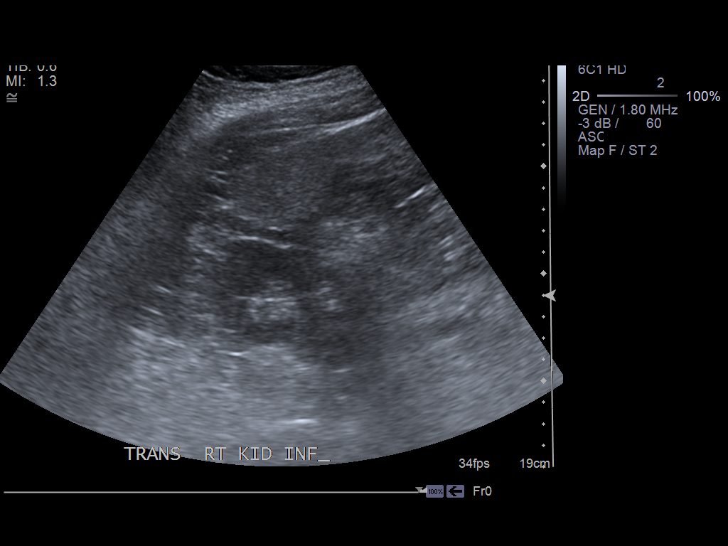
[im 33/99]
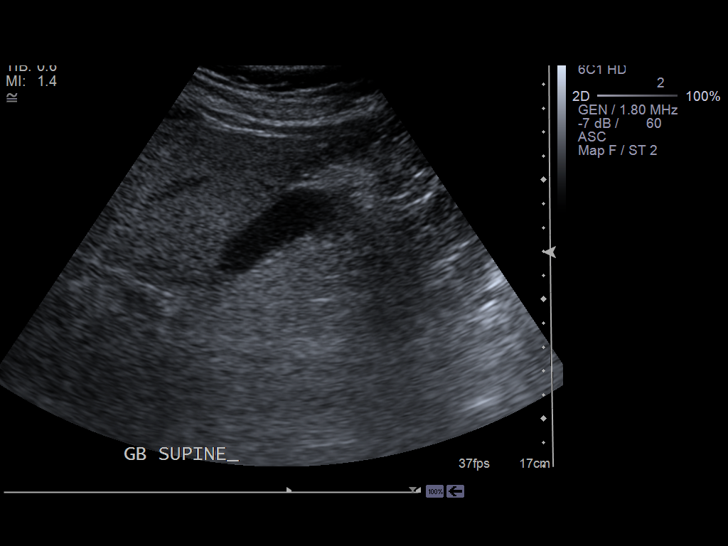
[im 37/99]
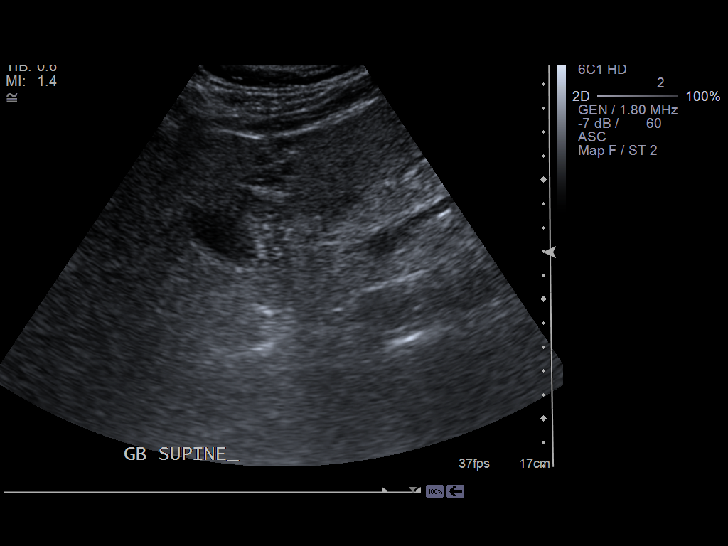
[im 45/99]
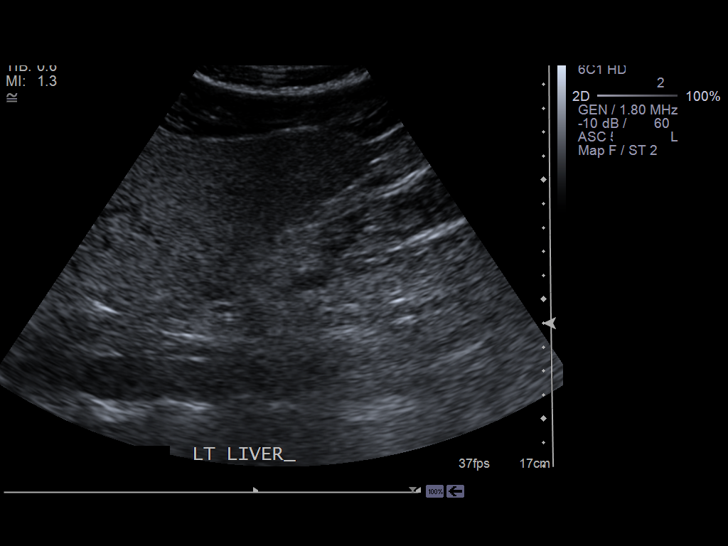
[im 54/99]
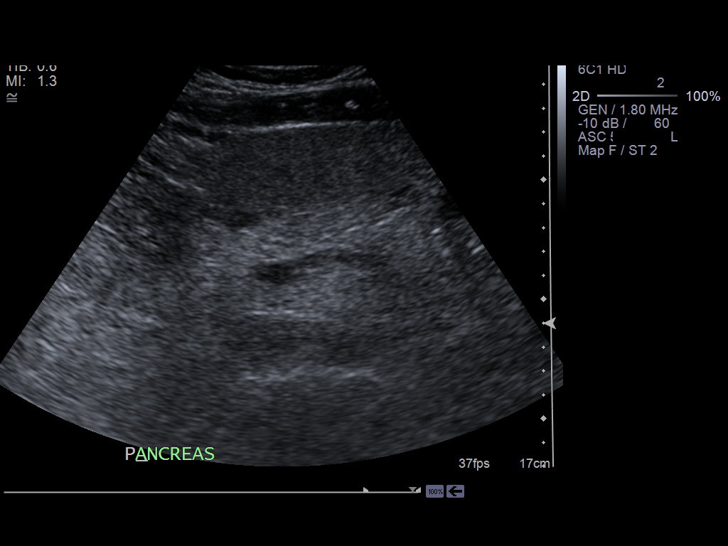
[im 62/99]
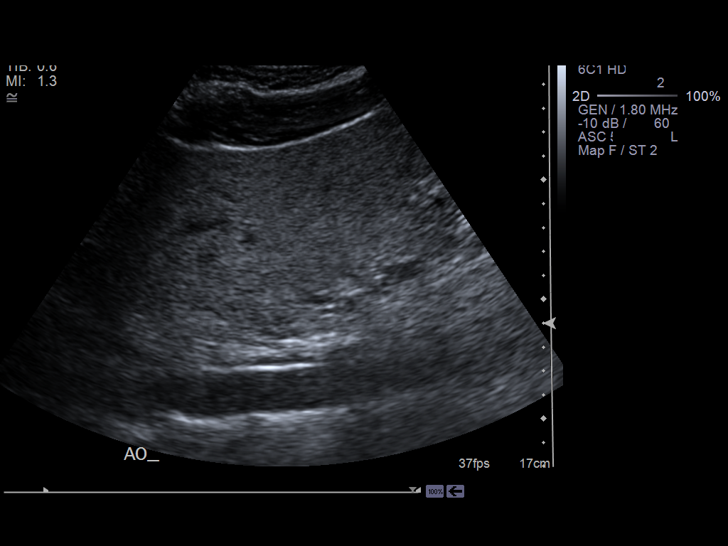
[im 66/99]
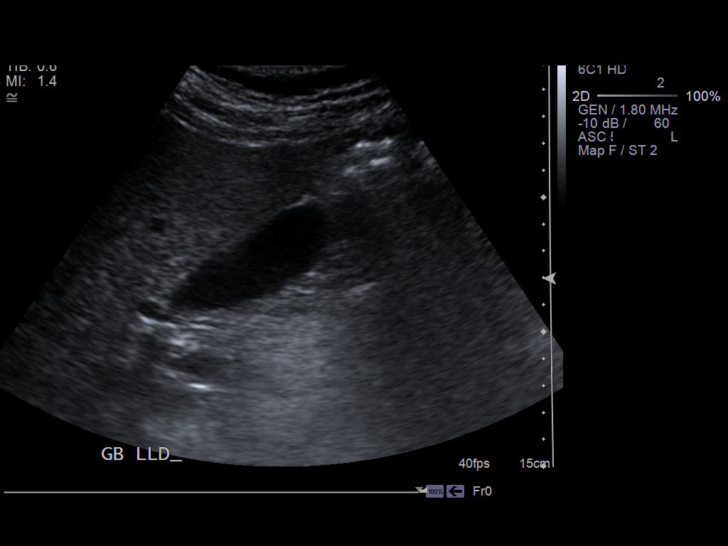
[im 74/99]
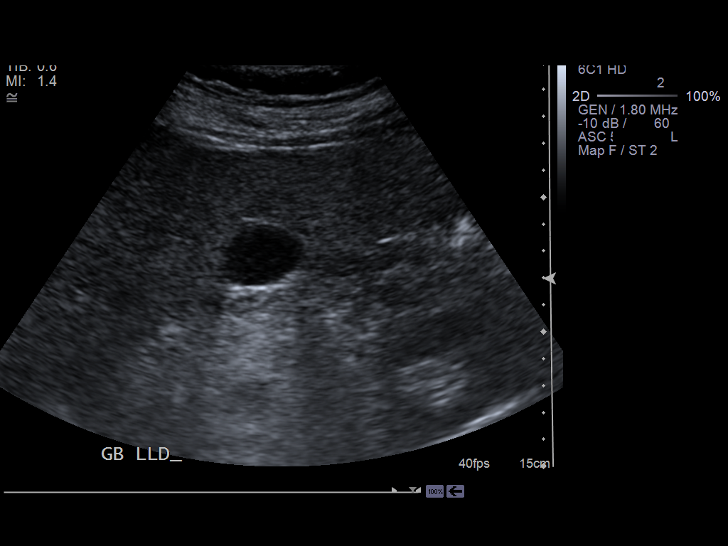
[im 82/99]
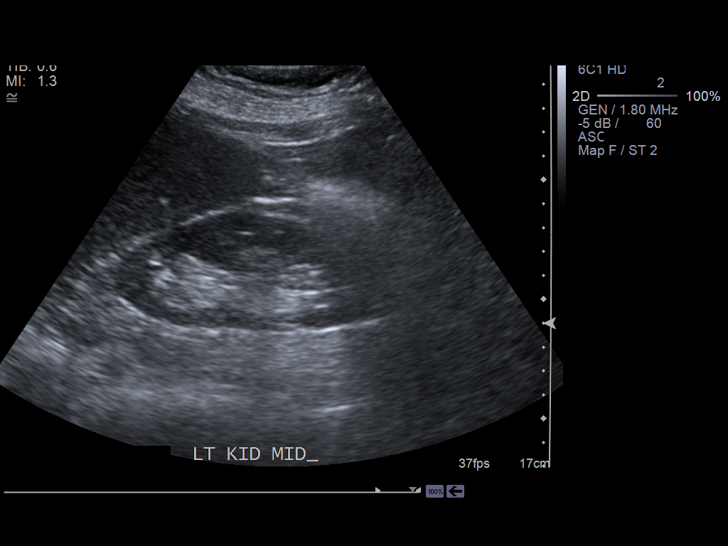
[im 90/99]
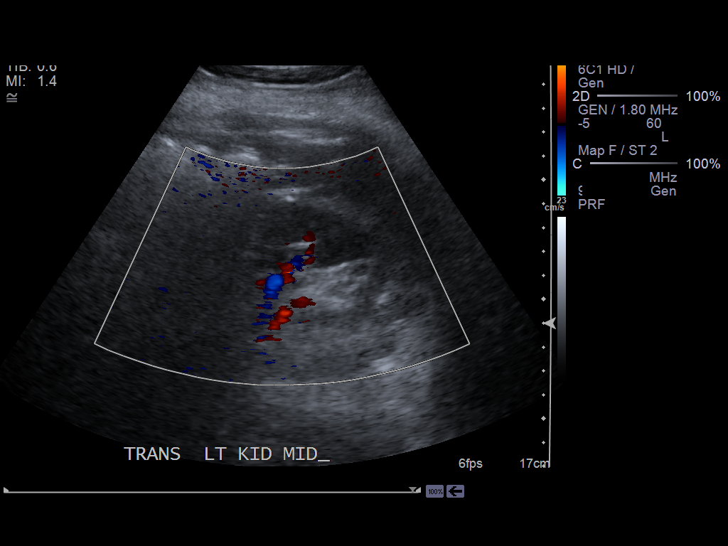
[im 99/99]
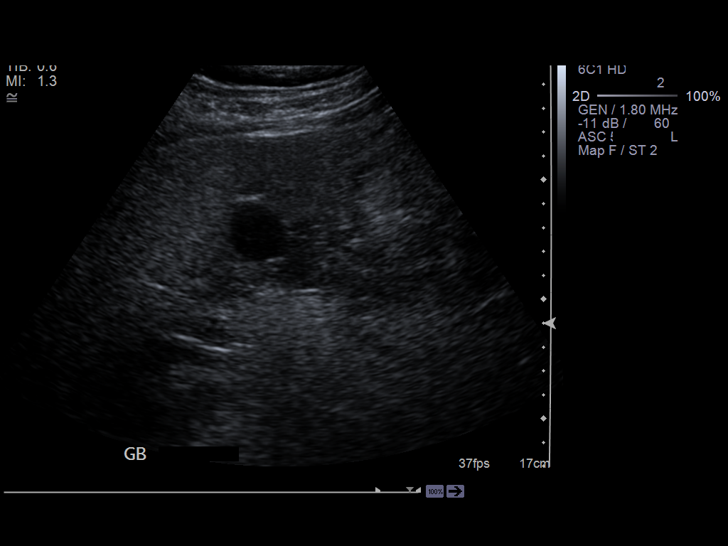

[14 of 25 positions shown; findings below may reference images not displayed]

FINDINGS: Visualized portions of the liver demonstrate normal echogenicity and normal
contours. The liver is mildly increased in size measuring 18.3 cm in length.
The liver is without evidence of a focal hepatic lesion.

There is no cholelithiasis or biliary sludge. There is no intra- or
extrahepatic biliary ductal dilatation. The common duct measures 5.4 mm in
maximal diameter. There is no gallbladder wall thickening, pericholecystic
fluid, or sonographic Murphy's sign.

The visualized portion of the pancreas is normal in echogenicity. The spleen
is mildly enlarged measuring 13.7 cm without focal abnormality. Bilateral
kidneys are normal in echogenicity and size. The right kidney measures 11 x
4.7 x 5.6 cm. The left kidney measures 11.9 x 5 x 5.9 cm. There are no renal
calculi or hydronephrosis. The abdominal aorta and IVC are unremarkable.
IMPRESSION: 1. Mild hepatosplenomegaly.

[REDACTED]

## 2014-10-01 ENCOUNTER — Other Ambulatory Visit: Payer: Self-pay | Admitting: *Deleted

## 2014-10-01 MED ORDER — LEVOTHYROXINE SODIUM 137 MCG PO TABS: ORAL_TABLET | ORAL | Status: DC

## 2014-11-10 ENCOUNTER — Other Ambulatory Visit: Payer: Self-pay | Admitting: Family Medicine

## 2014-11-10 DIAGNOSIS — E785 Hyperlipidemia, unspecified: Secondary | ICD-10-CM

## 2014-11-10 DIAGNOSIS — K760 Fatty (change of) liver, not elsewhere classified: Secondary | ICD-10-CM

## 2014-11-10 DIAGNOSIS — E039 Hypothyroidism, unspecified: Secondary | ICD-10-CM

## 2014-11-10 DIAGNOSIS — E669 Obesity, unspecified: Secondary | ICD-10-CM

## 2014-11-13 ENCOUNTER — Other Ambulatory Visit (INDEPENDENT_AMBULATORY_CARE_PROVIDER_SITE_OTHER): Payer: Managed Care, Other (non HMO)

## 2014-11-13 DIAGNOSIS — E785 Hyperlipidemia, unspecified: Secondary | ICD-10-CM

## 2014-11-13 DIAGNOSIS — E039 Hypothyroidism, unspecified: Secondary | ICD-10-CM

## 2014-11-13 DIAGNOSIS — K760 Fatty (change of) liver, not elsewhere classified: Secondary | ICD-10-CM

## 2014-11-13 LAB — LIPID PANEL
CHOL/HDL RATIO: 4
Cholesterol: 108 mg/dL (ref 0–200)
HDL: 29 mg/dL — AB (ref >39.00)
LDL Cholesterol: 45 mg/dL (ref 0–99)
NONHDL: 79
Triglycerides: 172 mg/dL — ABNORMAL HIGH (ref 0.0–149.0)
VLDL: 34.4 mg/dL (ref 0.0–40.0)

## 2014-11-13 LAB — COMPREHENSIVE METABOLIC PANEL
ALBUMIN: 4.2 g/dL (ref 3.5–5.2)
ALK PHOS: 81 U/L (ref 39–117)
ALT: 63 U/L — AB (ref 0–53)
AST: 25 U/L (ref 0–37)
BUN: 11 mg/dL (ref 6–23)
CHLORIDE: 106 meq/L (ref 96–112)
CO2: 29 mEq/L (ref 19–32)
Calcium: 9.1 mg/dL (ref 8.4–10.5)
Creatinine, Ser: 1.02 mg/dL (ref 0.40–1.50)
GFR: 87.81 mL/min (ref >60.00)
GLUCOSE: 113 mg/dL — AB (ref 70–99)
POTASSIUM: 4.1 meq/L (ref 3.5–5.1)
SODIUM: 139 meq/L (ref 135–145)
TOTAL PROTEIN: 7 g/dL (ref 6.0–8.3)
Total Bilirubin: 0.5 mg/dL (ref 0.2–1.2)

## 2014-11-13 LAB — TSH: TSH: 0.88 u[IU]/mL (ref 0.35–4.50)

## 2014-11-19 ENCOUNTER — Encounter: Payer: Managed Care, Other (non HMO) | Admitting: Family Medicine

## 2014-11-21 ENCOUNTER — Encounter: Payer: Self-pay | Admitting: Family Medicine

## 2014-11-21 ENCOUNTER — Ambulatory Visit (INDEPENDENT_AMBULATORY_CARE_PROVIDER_SITE_OTHER): Payer: Managed Care, Other (non HMO) | Admitting: Family Medicine

## 2014-11-21 VITALS — BP 118/80 | HR 62 | Temp 98.4°F | Ht 70.0 in | Wt 271.0 lb

## 2014-11-21 DIAGNOSIS — R739 Hyperglycemia, unspecified: Secondary | ICD-10-CM

## 2014-11-21 DIAGNOSIS — E039 Hypothyroidism, unspecified: Secondary | ICD-10-CM

## 2014-11-21 DIAGNOSIS — K7581 Nonalcoholic steatohepatitis (NASH): Secondary | ICD-10-CM

## 2014-11-21 DIAGNOSIS — Z0000 Encounter for general adult medical examination without abnormal findings: Secondary | ICD-10-CM

## 2014-11-21 DIAGNOSIS — E785 Hyperlipidemia, unspecified: Secondary | ICD-10-CM

## 2014-11-21 MED ORDER — LEVOTHYROXINE SODIUM 137 MCG PO TABS: ORAL_TABLET | ORAL | Status: DC

## 2014-11-21 NOTE — Patient Instructions (Addendum)
Look into vitamin D 1000 units or glucosamine for knee joints. Restart walking regimen with dog. Goal is 150 min/week of moderate intensity aerobic exercises.  Work on [employer]. Look into mediterranean diet.  Fatty Liver Fatty liver is the accumulation of fat in liver cells. It is also called hepatosteatosis or steatohepatitis. It is normal for your liver to contain some fat. If fat is more than 5 to 10% of your liver's weight, you have fatty liver.  There are often no symptoms (problems) for years while damage is still occurring. People often learn about their fatty liver when they have medical tests for other reasons. Fat can damage your liver for years or even decades without causing problems. When it becomes severe, it can cause fatigue, weight loss, weakness, and confusion. This makes you more likely to develop more serious liver problems. The liver is the largest organ in the body. It does a lot of work and often gives no warning signs when it is sick until late in a disease. The liver has many important jobs including:  Breaking down foods.  Storing vitamins, iron, and other minerals.  Making proteins.  Making bile for food digestion.  Breaking down many products including medications, alcohol and some poisons. CAUSES  There are a number of different conditions, medications, and poisons that can cause a fatty liver. Eating too many calories causes fat to build up in the liver. Not processing and breaking fats down normally may also cause this. Certain conditions, such as obesity, diabetes, and high triglycerides also cause this. Most fatty liver patients tend to be middle-aged and over weight.  Some causes of fatty liver are:  Alcohol over consumption.  Malnutrition.  Steroid use.  Valproic acid toxicity.  Obesity.  Cushing's syndrome.  Poisons.  Tetracycline in high dosages.  Pregnancy.  Diabetes.  Hyperlipidemia.  Rapid weight loss. Some people develop fatty  liver even having none of these conditions. SYMPTOMS  Fatty liver most often causes no problems. This is called asymptomatic.  It can be diagnosed with blood tests and also by a liver biopsy.  It is one of the most common causes of minor elevations of liver enzymes on routine blood tests.  Specialized Imaging of the liver using ultrasound, CT (computed tomography) scan, or MRI (magnetic resonance imaging) can suggest a fatty liver but a biopsy is needed to confirm it.  A biopsy involves taking a small sample of liver tissue. This is done by using a needle. It is then looked at under a microscope by a specialist. TREATMENT  It is important to treat the cause. Simple fatty liver without a medical reason may not need treatment.  Weight loss, fat restriction, and exercise in overweight patients produces inconsistent results but is worth trying.  Fatty liver due to alcohol toxicity may not improve even with stopping drinking.  Good control of diabetes may reduce fatty liver.  Lower your triglycerides through diet, medication or both.  Eat a balanced, healthy diet.  Increase your physical activity.  Get regular checkups from a liver specialist.  There are no medical or surgical treatments for a fatty liver or NASH, but improving your diet and increasing your exercise may help prevent or reverse some of the damage. PROGNOSIS  Fatty liver may cause no damage or it can lead to an inflammation of the liver. This is, called steatohepatitis. When it is linked to alcohol abuse, it is called alcoholic steatohepatitis. It often is not linked to alcohol. It is then called  nonalcoholic steatohepatitis, or NASH. Over time the liver may become scarred and hardened. This condition is called cirrhosis. Cirrhosis is serious and may lead to liver failure or cancer. NASH is one of the leading causes of cirrhosis. About 10-20% of Americans have fatty liver and a smaller 2-5% has NASH. Document Released:  11/05/2005 Document Revised: 12/13/2011 Document Reviewed: 01/30/2014 [facility] Patient Information 2015 [place], [place]. This information is not intended to replace advice given to you by your health care provider. Make sure you discuss any questions you have with your health care provider.

## 2014-11-21 NOTE — Assessment & Plan Note (Signed)
cpe

## 2014-11-21 NOTE — Assessment & Plan Note (Addendum)
Body mass index is 38.88 kg/(m^2).  Discussed healthy diet/lifestyle changes to affect sustainable weight loss. Pt thinks he could restart walking regimen.

## 2014-11-21 NOTE — Assessment & Plan Note (Signed)
MRI reviewed with patient. Recommend imaging Q2 yrs - will check next visit. Pt agrees with plan. Continue regular monitoring of LFTs. Encouraged weight loss.

## 2014-11-21 NOTE — Progress Notes (Signed)
BP 118/80 mmHg  Pulse 62  Temp(Src) 98.4 F (36.9 C) (Oral)  Ht  (1.778 m)  Wt 271 lb (122.925 kg)  BMI 38.88 kg/m2   CC: CPE  Subjective:    Patient ID: [name], male    DOB: Jan 20, 1981, 34 y.o.   MRN: [id number]  HPI: Antonio Doyle is a 34 y.o. male presenting on 11/21/2014 for Annual Exam   Noticing increasing pain bilateral knees L>R.   Preventative: Flu - allergic to eggs  Td - thinks has had a few years ago Seat belt use discussed. Sunscreen use discussed.  Caffeine: mt dew Lives with wife and 2 daughters, 2 dogs Occupation: Psychiatrist Edu: college Activity: no regular exercise Diet: good water, fruits/vegetables daily  Relevant past medical, surgical, family and social history reviewed and updated as indicated. Interim medical history since our last visit reviewed. Allergies and medications reviewed and updated. No current outpatient prescriptions on file prior to visit.   No current facility-administered medications on file prior to visit.    Review of Systems  Constitutional: Negative for fever, chills, activity change, appetite change, fatigue and unexpected weight change.  HENT: Negative for hearing loss.   Eyes: Negative for visual disturbance.  Respiratory: Negative for cough, chest tightness, shortness of breath and wheezing.   Cardiovascular: Negative for chest pain, palpitations and leg swelling.  Gastrointestinal: Negative for nausea, vomiting, abdominal pain, diarrhea, constipation, blood in stool and abdominal distention.  Genitourinary: Negative for hematuria and difficulty urinating.  Musculoskeletal: Negative for myalgias, arthralgias and neck pain.  Skin: Negative for rash.  Neurological: Negative for dizziness, seizures, syncope and headaches.  Hematological: Negative for adenopathy. Does not bruise/bleed easily.  Psychiatric/Behavioral: Negative for dysphoric mood. The patient is not  nervous/anxious.    Per HPI unless specifically indicated above     Objective:    BP 118/80 mmHg  Pulse 62  Temp(Src) 98.4 F (36.9 C) (Oral)  Ht  (1.778 m)  Wt 271 lb (122.925 kg)  BMI 38.88 kg/m2  Wt Readings from Last 3 Encounters:  11/21/14 271 lb (122.925 kg)  01/11/14 264 lb (119.75 kg)  03/05/13 266 lb (120.657 kg)    Physical Exam  Constitutional: He is oriented to person, place, and time. He appears well-developed and well-nourished. No distress.  HENT:  Head: Normocephalic and atraumatic.  Right Ear: Hearing, tympanic membrane, external ear and ear canal normal.  Left Ear: Hearing, tympanic membrane, external ear and ear canal normal.  Nose: Nose normal.  Mouth/Throat: Uvula is midline, oropharynx is clear and moist and mucous membranes are normal. No oropharyngeal exudate, posterior oropharyngeal edema or posterior oropharyngeal erythema.  Eyes: Conjunctivae and EOM are normal. Pupils are equal, round, and reactive to light. No scleral icterus.  Neck: Normal range of motion. Neck supple. No thyromegaly present.  Cardiovascular: Normal rate, regular rhythm, normal heart sounds and intact distal pulses.   No murmur heard. Pulses:      Radial pulses are 2+ on the right side, and 2+ on the left side.  Pulmonary/Chest: Effort normal and breath sounds normal. No respiratory distress. He has no wheezes. He has no rales.  Abdominal: Soft. Bowel sounds are normal. He exhibits no distension and no mass. There is no tenderness. There is no rebound and no guarding.  Musculoskeletal: Normal range of motion. He exhibits no edema.  Lymphadenopathy:    He has no cervical adenopathy.  Neurological: He is alert and oriented to person,  place, and time.  CN grossly intact, station and gait intact  Skin: Skin is warm and dry. No rash noted.  Psychiatric: He has a normal mood and affect. His behavior is normal. Judgment and thought content normal.  Nursing note and vitals  reviewed.  Results for orders placed or performed in visit on 11/13/14  Lipid panel  Result Value Ref Range   Cholesterol 108 0 - 200 mg/dL   Triglycerides 045.4172.0 (H) 0.0 - 149.0 mg/dL   HDL 09.8129.00 (L) >19.14>39.00 mg/dL   VLDL 78.234.4 0.0 - 95.640.0 mg/dL   LDL Cholesterol 45 0 - 99 mg/dL   Total CHOL/HDL Ratio 4    NonHDL 79.00   Comprehensive metabolic panel  Result Value Ref Range   Sodium 139 135 - 145 mEq/L   Potassium 4.1 3.5 - 5.1 mEq/L   Chloride 106 96 - 112 mEq/L   CO2 29 19 - 32 mEq/L   Glucose, Bld 113 (H) 70 - 99 mg/dL   BUN 11 6 - 23 mg/dL   Creatinine, Ser 2.131.02 0.40 - 1.50 mg/dL   Total Bilirubin 0.5 0.2 - 1.2 mg/dL   Alkaline Phosphatase 81 39 - 117 U/L   AST 25 0 - 37 U/L   ALT 63 (H) 0 - 53 U/L   Total Protein 7.0 6.0 - 8.3 g/dL   Albumin 4.2 3.5 - 5.2 g/dL   Calcium 9.1 8.4 - 08.610.5 mg/dL   GFR 57.8487.81 >69.62>60.00 mL/min  TSH  Result Value Ref Range   TSH 0.88 0.35 - 4.50 uIU/mL      Assessment & Plan:   Problem List Items Addressed This Visit    NASH (nonalcoholic steatohepatitis)    MRI reviewed with patient. Recommend imaging Q2 yrs - will check next visit. Pt agrees with plan. Continue regular monitoring of LFTs. Encouraged weight loss.      Morbid obesity    Body mass index is 38.88 kg/(m^2).  Discussed healthy diet/lifestyle changes to affect sustainable weight loss. Pt thinks he could restart walking regimen.      Hypothyroidism    Chronic, stable. Continue regimen.      Relevant Medications   levothyroxine (SYNTHROID, LEVOTHROID) tablet   Hyperglycemia    Reviewed concerns for prediabetes, again emphasized importance of weight loss.      Healthcare maintenance - Primary    cpe       Dyslipidemia    Reviewed with patient. Discussed healthy diet changes to improve #s.          Follow up plan: Return in about 1 year (around 11/22/2015), or as needed, for annual exam, prior fasting for blood work.

## 2014-11-21 NOTE — Assessment & Plan Note (Signed)
Reviewed concerns for prediabetes, again emphasized importance of weight loss.

## 2014-11-21 NOTE — Assessment & Plan Note (Addendum)
Reviewed with patient. Discussed healthy diet changes to improve #s.

## 2014-11-21 NOTE — Progress Notes (Signed)
Pre visit review using our clinic review tool, if applicable. No additional management support is needed unless otherwise documented below in the visit note. 

## 2014-11-21 NOTE — Assessment & Plan Note (Signed)
Chronic, stable. Continue regimen. 

## 2015-02-28 ENCOUNTER — Encounter: Payer: Self-pay | Admitting: Family Medicine

## 2015-02-28 ENCOUNTER — Ambulatory Visit (INDEPENDENT_AMBULATORY_CARE_PROVIDER_SITE_OTHER): Payer: Managed Care, Other (non HMO) | Admitting: Family Medicine

## 2015-02-28 VITALS — BP 116/82 | HR 76 | Temp 98.4°F | Wt 261.0 lb

## 2015-02-28 DIAGNOSIS — H6690 Otitis media, unspecified, unspecified ear: Secondary | ICD-10-CM

## 2015-02-28 MED ORDER — AMOXICILLIN-POT CLAVULANATE 875-125 MG PO TABS: 1.0000 | ORAL_TABLET | Freq: Two times a day (BID) | ORAL | Status: DC

## 2015-02-28 NOTE — Progress Notes (Signed)
Pre visit review using our clinic review tool, if applicable. No additional management support is needed unless otherwise documented below in the visit note.  Can't hear from L ear for the last few days.  Pressure on L side of face.  No FCNAVD.  No facial pain, just pressure that is better in a hot shower.  R ear is fine.  No ST.  No cough.  No rash.    Meds, vitals, and allergies reviewed.   ROS: See HPI.  Otherwise, noncontributory.  GEN: nad, alert and oriented HEENT: mucous membranes moist, B cerumen impaction removed with R tm w/o erythema, L TM red and bulging, nasal exam w/o erythema, clear discharge noted,  OP with cobblestoning NECK: supple w/o LA CV: rrr.   PULM: ctab, no inc wob

## 2015-02-28 NOTE — Patient Instructions (Signed)
Start augmentin.  Take care.  Glad to see you.  Bilateral ear wax, resolved.  Left ear infection.

## 2015-03-03 NOTE — Assessment & Plan Note (Signed)
Bilateral ear wax, resolved.  Start augmentin for AOM, f/u prn.  D/w pt.  He agrees.

## 2015-08-12 ENCOUNTER — Telehealth: Payer: Self-pay

## 2015-08-12 NOTE — Telephone Encounter (Signed)
V/M left to change mail order to [facility](not the specialty but just [facility]); does not need refill now.

## 2015-08-13 ENCOUNTER — Telehealth: Payer: Self-pay

## 2015-08-13 MED ORDER — LEVOTHYROXINE SODIUM 137 MCG PO TABS: ORAL_TABLET | ORAL | Status: DC

## 2015-08-13 NOTE — Telephone Encounter (Signed)
Pt's wife said pt does need refill levothyroxine to [facility]. Last TSH and annual 11/2014. Advised refilled per protocol.

## 2015-08-19 NOTE — Telephone Encounter (Signed)
Mrs Westgate request med be sent to [facility] instead of mail order; mail order was sent per pts wife request 08/13/15; Mrs Grimme will need to ck with mail order to see if she can stop refill there or has rx already been sent.left v/m requesting cb from Mrs Rensiing.

## 2015-08-26 NOTE — Telephone Encounter (Signed)
Mrs Antonio Doyle said had already received med from mail order pharmacy and since will be required to use them next year will leave everything as is. Nothing further needed.

## 2015-10-15 ENCOUNTER — Encounter: Payer: Self-pay | Admitting: Family Medicine

## 2015-10-15 ENCOUNTER — Ambulatory Visit (INDEPENDENT_AMBULATORY_CARE_PROVIDER_SITE_OTHER): Payer: BLUE CROSS/BLUE SHIELD | Admitting: Family Medicine

## 2015-10-15 VITALS — BP 136/82 | HR 71 | Temp 98.2°F | Ht 70.0 in | Wt 275.5 lb

## 2015-10-15 DIAGNOSIS — B9789 Other viral agents as the cause of diseases classified elsewhere: Principal | ICD-10-CM

## 2015-10-15 DIAGNOSIS — J069 Acute upper respiratory infection, unspecified: Secondary | ICD-10-CM

## 2015-10-15 MED ORDER — HYDROCOD POLST-CPM POLST ER 10-8 MG/5ML PO SUER: 5.0000 mL | Freq: Two times a day (BID) | ORAL | Status: DC | PRN

## 2015-10-15 NOTE — Progress Notes (Signed)
Pre visit review using our clinic review tool, if applicable. No additional management support is needed unless otherwise documented below in the visit note. 

## 2015-10-15 NOTE — Progress Notes (Signed)
   Subjective:  Patient ID: Antonio Doyle, male    DOB: July 26, 1981  Age: 35 y.o. MRN: [id number]  CC: Cold symptoms  HPI:  35 year old male presents to clinic today with complaints of cold symptoms.  Patient states that he's not felt well since this past Sunday. He's been experiencing congestion, sneezing, runny nose, sore throat and cough. Patient taking DayQuil and NyQuil with no improvement. No associated fever or shortness of breath. No exacerbating or relieving factors. No other complaints at this time.  Social Hx   Social History   Social History  . Marital Status: Married    Spouse Name: N/A  . Number of Children: 2  . Years of Education: N/A   Occupational History  . 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 Smoking status: Never Smoker   . Smokeless tobacco: Never Used  . Alcohol Use: No  . Drug Use: No  . Sexual Activity: Not Asked   Other Topics Concern  . 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  HENT: Positive for congestion, rhinorrhea and sore throat.    Objective:  BP 136/82 mmHg  Pulse 71  Temp(Src) 98.2 F (36.8 C) (Oral)  Ht 5\' 10"  (1.778 m)  Wt 275 lb 8 oz (124.966 kg)  BMI 39.53 kg/m2  SpO2 97%  BP/Weight 10/15/2015 02/28/2015 11/21/2014  Systolic BP 136 116 118  Diastolic BP 82 82 80  Wt. (Lbs) 275.5 261 271  BMI 39.53 37.45 38.88   Physical Exam  Constitutional: He appears well-developed. No distress.  HENT:  Head: Normocephalic and atraumatic.  Mouth/Throat: Oropharynx is clear and moist.  TM's obscured bilaterally.  Eyes: Conjunctivae are normal. Right eye exhibits no discharge. Left eye exhibits no discharge.  Neck: Neck supple.  Cardiovascular: Normal rate and  regular rhythm.   Pulmonary/Chest: Effort normal and breath sounds normal.  Lymphadenopathy:    He has no cervical adenopathy.  Neurological: He is alert.  Psychiatric: He has a normal mood and affect.  Vitals reviewed.  Lab Results  Component Value Date   GLUCOSE 113* 11/13/2014   CHOL 108 11/13/2014   TRIG 172.0* 11/13/2014   HDL 29.00* 11/13/2014   LDLCALC 45 11/13/2014   ALT 63* 11/13/2014   AST 25 11/13/2014   NA 139 11/13/2014   K 4.1 11/13/2014   CL 106 11/13/2014   CREATININE 1.02 11/13/2014   BUN 11 11/13/2014   CO2 29 11/13/2014   TSH 0.88 11/13/2014   INR 1.1* 02/07/2013   HGBA1C 5.4 01/16/2013   Assessment & Plan:   Problem List Items Addressed This Visit    Viral URI with cough - Primary    New problem. Exam unremarkable. Treating cough with Tussionex. Supportive care.         Meds ordered this encounter  Medications  . chlorpheniramine-HYDROcodone (TUSSIONEX PENNKINETIC ER) 10-8 MG/5ML SUER    Sig: Take 5 mLs by mouth every 12 (twelve) hours as needed.    Dispense:  115 mL    Refill:  0    Follow-up: PRN  Everlene OtherJayce Norlene Lanes DO [facility] [place]

## 2015-10-15 NOTE — Assessment & Plan Note (Signed)
New problem. Exam unremarkable. Treating cough with Tussionex. Supportive care.

## 2015-10-15 NOTE — Patient Instructions (Signed)
You have a viral illness.  Be sure to get plenty of rest.  Use the cough medication as needed.  Let Dr. Reece AgarG know if you fail to improve or worsen.  Take care  Dr. Adriana Simasook

## 2016-02-08 ENCOUNTER — Other Ambulatory Visit: Payer: Self-pay | Admitting: Family Medicine

## 2016-02-08 DIAGNOSIS — K7581 Nonalcoholic steatohepatitis (NASH): Secondary | ICD-10-CM

## 2016-02-08 DIAGNOSIS — R739 Hyperglycemia, unspecified: Secondary | ICD-10-CM

## 2016-02-08 DIAGNOSIS — E785 Hyperlipidemia, unspecified: Secondary | ICD-10-CM

## 2016-02-08 DIAGNOSIS — E039 Hypothyroidism, unspecified: Secondary | ICD-10-CM

## 2016-02-09 ENCOUNTER — Other Ambulatory Visit (INDEPENDENT_AMBULATORY_CARE_PROVIDER_SITE_OTHER): Payer: BLUE CROSS/BLUE SHIELD

## 2016-02-09 DIAGNOSIS — E039 Hypothyroidism, unspecified: Secondary | ICD-10-CM | POA: Diagnosis not present

## 2016-02-09 DIAGNOSIS — K7581 Nonalcoholic steatohepatitis (NASH): Secondary | ICD-10-CM

## 2016-02-09 DIAGNOSIS — E785 Hyperlipidemia, unspecified: Secondary | ICD-10-CM | POA: Diagnosis not present

## 2016-02-09 DIAGNOSIS — R739 Hyperglycemia, unspecified: Secondary | ICD-10-CM

## 2016-02-09 LAB — COMPREHENSIVE METABOLIC PANEL
ALT: 74 U/L — AB (ref 0–53)
AST: 31 U/L (ref 0–37)
Albumin: 4.1 g/dL (ref 3.5–5.2)
Alkaline Phosphatase: 78 U/L (ref 39–117)
BILIRUBIN TOTAL: 0.4 mg/dL (ref 0.2–1.2)
BUN: 12 mg/dL (ref 6–23)
CALCIUM: 9 mg/dL (ref 8.4–10.5)
CHLORIDE: 106 meq/L (ref 96–112)
CO2: 26 meq/L (ref 19–32)
CREATININE: 1.01 mg/dL (ref 0.40–1.50)
GFR: 88.21 mL/min (ref >60.00)
GLUCOSE: 112 mg/dL — AB (ref 70–99)
Potassium: 3.7 mEq/L (ref 3.5–5.1)
SODIUM: 138 meq/L (ref 135–145)
Total Protein: 6.7 g/dL (ref 6.0–8.3)

## 2016-02-09 LAB — LIPID PANEL
CHOL/HDL RATIO: 4
Cholesterol: 104 mg/dL (ref 0–200)
HDL: 27 mg/dL — ABNORMAL LOW (ref >39.00)
LDL Cholesterol: 39 mg/dL (ref 0–99)
NONHDL: 76.52
TRIGLYCERIDES: 188 mg/dL — AB (ref 0.0–149.0)
VLDL: 37.6 mg/dL (ref 0.0–40.0)

## 2016-02-09 LAB — TSH: TSH: 0.56 u[IU]/mL (ref 0.35–4.50)

## 2016-02-09 LAB — HEMOGLOBIN A1C: Hgb A1c MFr Bld: 5.8 % (ref 4.6–6.5)

## 2016-02-13 ENCOUNTER — Encounter: Payer: BLUE CROSS/BLUE SHIELD | Admitting: Family Medicine

## 2016-02-17 ENCOUNTER — Encounter: Payer: Self-pay | Admitting: Family Medicine

## 2016-02-17 ENCOUNTER — Ambulatory Visit (INDEPENDENT_AMBULATORY_CARE_PROVIDER_SITE_OTHER): Payer: BLUE CROSS/BLUE SHIELD | Admitting: Family Medicine

## 2016-02-17 VITALS — BP 118/74 | HR 68 | Temp 98.0°F | Ht 70.0 in | Wt 272.0 lb

## 2016-02-17 DIAGNOSIS — Z0000 Encounter for general adult medical examination without abnormal findings: Secondary | ICD-10-CM | POA: Diagnosis not present

## 2016-02-17 DIAGNOSIS — E785 Hyperlipidemia, unspecified: Secondary | ICD-10-CM

## 2016-02-17 DIAGNOSIS — K7581 Nonalcoholic steatohepatitis (NASH): Secondary | ICD-10-CM

## 2016-02-17 DIAGNOSIS — R7303 Prediabetes: Secondary | ICD-10-CM

## 2016-02-17 DIAGNOSIS — E039 Hypothyroidism, unspecified: Secondary | ICD-10-CM | POA: Diagnosis not present

## 2016-02-17 DIAGNOSIS — IMO0001 Reserved for inherently not codable concepts without codable children: Secondary | ICD-10-CM

## 2016-02-17 NOTE — Progress Notes (Signed)
Pre visit review using our clinic review tool, if applicable. No additional management support is needed unless otherwise documented below in the visit note. 

## 2016-02-17 NOTE — Assessment & Plan Note (Signed)
Reviewed A1c with patient.  

## 2016-02-17 NOTE — Patient Instructions (Addendum)
You are doing well today Try incorporating aerobic exercise into routine - whether that's increased walking or more use of Wii Fit. We will continue watching sugar and cholesterol control for optimal management of fatty liver. Return as needed or in 1 year for next physical.  Health Maintenance, Male A healthy lifestyle and preventative care can promote health and wellness.  Maintain regular health, dental, and eye exams.  Eat a healthy diet. Foods like vegetables, fruits, whole grains, low-fat dairy products, and lean protein foods contain the nutrients you need and are low in calories. Decrease your intake of foods high in solid fats, added sugars, and salt. Get information about a proper diet from your health care provider, if necessary.  Regular physical exercise is one of the most important things you can do for your health. Most adults should get at least 150 minutes of moderate-intensity exercise (any activity that increases your heart rate and causes you to sweat) each week. In addition, most adults need muscle-strengthening exercises on 2 or more days a week.   Maintain a healthy weight. The body mass index (BMI) is a screening tool to identify possible weight problems. It provides an estimate of body fat based on height and weight. Your health care provider can find your BMI and can help you achieve or maintain a healthy weight. For males 20 years and older:  A BMI below 18.5 is considered underweight.  A BMI of 18.5 to 24.9 is normal.  A BMI of 25 to 29.9 is considered overweight.  A BMI of 30 and above is considered obese.  Maintain normal blood lipids and cholesterol by exercising and minimizing your intake of saturated fat. Eat a balanced diet with plenty of fruits and vegetables. Blood tests for lipids and cholesterol should begin at age 37 and be repeated every 5 years. If your lipid or cholesterol levels are high, you are over age 37, or you are at high risk for heart  disease, you may need your cholesterol levels checked more frequently.Ongoing high lipid and cholesterol levels should be treated with medicines if diet and exercise are not working.  If you smoke, find out from your health care provider how to quit. If you do not use tobacco, do not start.  Lung cancer screening is recommended for adults aged 55-80 years who are at high risk for developing lung cancer because of a history of smoking. A yearly low-dose CT scan of the lungs is recommended for people who have at least a 30-pack-year history of smoking and are current smokers or have quit within the past 15 years. A pack year of smoking is smoking an average of 1 pack of cigarettes a day for 1 year (for example, a 30-pack-year history of smoking could mean smoking 1 pack a day for 30 years or 2 packs a day for 15 years). Yearly screening should continue until the smoker has stopped smoking for at least 15 years. Yearly screening should be stopped for people who develop a health problem that would prevent them from having lung cancer treatment.  If you choose to drink alcohol, do not have more than 2 drinks per day. One drink is considered to be 12 oz (360 mL) of beer, 5 oz (150 mL) of wine, or 1.5 oz (45 mL) of liquor.  Avoid the use of street drugs. Do not share needles with anyone. Ask for help if you need support or instructions about stopping the use of drugs.  High blood pressure causes  heart disease and increases the risk of stroke. High blood pressure is more likely to develop in:  People who have blood pressure in the end of the normal range (100-139/85-89 mm Hg).  People who are overweight or obese.  People who are African American.  If you are 29-33 years of age, have your blood pressure checked every 3-5 years. If you are 55 years of age or older, have your blood pressure checked every year. You should have your blood pressure measured twice--once when you are at a hospital or clinic, and  once when you are not at a hospital or clinic. Record the average of the two measurements. To check your blood pressure when you are not at a hospital or clinic, you can use:  An automated blood pressure machine at a pharmacy.  A home blood pressure monitor.  If you are 37-16 years old, ask your health care provider if you should take aspirin to prevent heart disease.  Diabetes screening involves taking a blood sample to check your fasting blood sugar level. This should be done once every 3 years after age 8 if you are at a normal weight and without risk factors for diabetes. Testing should be considered at a younger age or be carried out more frequently if you are overweight and have at least 1 risk factor for diabetes.  Colorectal cancer can be detected and often prevented. Most routine colorectal cancer screening begins at the age of 64 and continues through age 51. However, your health care provider may recommend screening at an earlier age if you have risk factors for colon cancer. On a yearly basis, your health care provider may provide home test kits to check for hidden blood in the stool. A small camera at the end of a tube may be used to directly examine the colon (sigmoidoscopy or colonoscopy) to detect the earliest forms of colorectal cancer. Talk to your health care provider about this at age 64 when routine screening begins. A direct exam of the colon should be repeated every 5-10 years through age 45, unless early forms of precancerous polyps or small growths are found.  People who are at an increased risk for hepatitis B should be screened for this virus. You are considered at high risk for hepatitis B if:  You were born in a country where hepatitis B occurs often. Talk with your health care provider about which countries are considered high risk.  Your parents were born in a high-risk country and you have not received a shot to protect against hepatitis B (hepatitis B  vaccine).  You have HIV or AIDS.  You use needles to inject street drugs.  You live with, or have sex with, someone who has hepatitis B.  You are a man who has sex with other men (MSM).  You get hemodialysis treatment.  You take certain medicines for conditions like cancer, organ transplantation, and autoimmune conditions.  Hepatitis C blood testing is recommended for all people born from [birth year] through 1967 and any individual with known risk factors for hepatitis C.  Healthy men should no longer receive prostate-specific antigen (PSA) blood tests as part of routine cancer screening. Talk to your health care provider about prostate cancer screening.  Testicular cancer screening is not recommended for adolescents or adult males who have no symptoms. Screening includes self-exam, a health care provider exam, and other screening tests. Consult with your health care provider about any symptoms you have or any concerns you have about  testicular cancer.  Practice safe sex. Use condoms and avoid high-risk sexual practices to reduce the spread of sexually transmitted infections (STIs).  You should be screened for STIs, including gonorrhea and chlamydia if:  You are sexually active and are younger than 24 years.  You are older than 24 years, and your health care provider tells you that you are at risk for this type of infection.  Your sexual activity has changed since you were last screened, and you are at an increased risk for chlamydia or gonorrhea. Ask your health care provider if you are at risk.  If you are at risk of being infected with HIV, it is recommended that you take a prescription medicine daily to prevent HIV infection. This is called pre-exposure prophylaxis (PrEP). You are considered at risk if:  You are a man who has sex with other men (MSM).  You are a heterosexual man who is sexually active with multiple partners.  You take drugs by injection.  You are sexually active  with a partner who has HIV.  Talk with your health care provider about whether you are at high risk of being infected with HIV. If you choose to begin PrEP, you should first be tested for HIV. You should then be tested every 3 months for as long as you are taking PrEP.  Use sunscreen. Apply sunscreen liberally and repeatedly throughout the day. You should seek shade when your shadow is shorter than you. Protect yourself by wearing long sleeves, pants, a wide-brimmed hat, and sunglasses year round whenever you are outdoors.  Tell your health care provider of new moles or changes in moles, especially if there is a change in shape or color. Also, tell your health care provider if a mole is larger than the size of a pencil eraser.  A one-time screening for abdominal aortic aneurysm (AAA) and surgical repair of large AAAs by ultrasound is recommended for men aged 69-75 years who are current or former smokers.  Stay current with your vaccines (immunizations).   This information is not intended to replace advice given to you by your health care provider. Make sure you discuss any questions you have with your health care provider.   Document Released: 03/18/2008 Document Revised: 10/11/2014 Document Reviewed: 02/15/2011 Elsevier Interactive Patient Education [employer].

## 2016-02-17 NOTE — Assessment & Plan Note (Addendum)
Reviewed with patient. LFTs stable (mild transaminitis). Consider rpt abd US in next 1-2 years. Encouraged weight loss as well as good sugar and cholesterol control to help manage condition. Will need to discuss hep A/B shots at f/u visit.

## 2016-02-17 NOTE — Assessment & Plan Note (Signed)
Discussed healthy diet and lifestyle changes to affect sustainable weight loss  

## 2016-02-17 NOTE — Assessment & Plan Note (Signed)
Reviewed diet changes to affect improved lipid control

## 2016-02-17 NOTE — Assessment & Plan Note (Signed)
Chronic, stable. Continue current regimen. 

## 2016-02-17 NOTE — Progress Notes (Signed)
BP 118/74 mmHg  Pulse 68  Temp(Src) 98 F (36.7 C) (Oral)  Ht  (1.778 m)  Wt 272 lb (123.378 kg)  BMI 39.03 kg/m2   CC: CPE  Subjective:    Patient ID: Antonio Doyle, male    DOB: 11-14-1980, 35 y.o.   MRN: [id number]  HPI: Antonio Doyle is a 35 y.o. male presenting on 02/17/2016 for Annual Exam   Preventative: Flu - allergic to eggs  Td ~2010 Seat belt use discussed.  Sunscreen use discussed. No changing moles on skin.   Caffeine: mt dew  Lives with wife and 2 daughters, 2 dogs  Occupation: [employer] in [place]  Edu: college  Activity: no regular exercise  Diet: good water, 2 mt dew daily, fruits/vegetables daily   Relevant past medical, surgical, family and social history reviewed and updated as indicated. Interim medical history since our last visit reviewed. Allergies and medications reviewed and updated. Current Outpatient Prescriptions on File Prior to Visit  Medication Sig  . levothyroxine (SYNTHROID, LEVOTHROID) 137 MCG tablet Take one tablet by mouth one time daily   No current facility-administered medications on file prior to visit.    Review of Systems  Constitutional: Negative for fever, chills, activity change, appetite change, fatigue and unexpected weight change.  HENT: Positive for sinus pressure. Negative for hearing loss.   Eyes: Negative for visual disturbance.  Respiratory: Negative for cough, chest tightness, shortness of breath and wheezing.   Cardiovascular: Negative for chest pain, palpitations and leg swelling.  Gastrointestinal: Negative for nausea, vomiting, abdominal pain, diarrhea, constipation, blood in stool and abdominal distention.  Genitourinary: Negative for hematuria and difficulty urinating.  Musculoskeletal: Negative for myalgias, arthralgias and neck pain.  Skin: Negative for rash.  Neurological: Positive for headaches (sinuses). Negative for dizziness, seizures and syncope.  Hematological: Negative for adenopathy.  Does not bruise/bleed easily.  Psychiatric/Behavioral: Negative for dysphoric mood. The patient is not nervous/anxious.    Per HPI unless specifically indicated in ROS section     Objective:    BP 118/74 mmHg  Pulse 68  Temp(Src) 98 F (36.7 C) (Oral)  Ht  (1.778 m)  Wt 272 lb (123.378 kg)  BMI 39.03 kg/m2  Wt Readings from Last 3 Encounters:  02/17/16 272 lb (123.378 kg)  10/15/15 275 lb 8 oz (124.966 kg)  02/28/15 261 lb (118.389 kg)    Physical Exam  Constitutional: He is oriented to person, place, and time. He appears well-developed and well-nourished. No distress.  HENT:  Head: Normocephalic and atraumatic.  Right Ear: Hearing, tympanic membrane, external ear and ear canal normal.  Left Ear: Hearing, tympanic membrane, external ear and ear canal normal.  Nose: Nose normal.  Mouth/Throat: Uvula is midline, oropharynx is clear and moist and mucous membranes are normal. No oropharyngeal exudate, posterior oropharyngeal edema or posterior oropharyngeal erythema.  Eyes: Conjunctivae and EOM are normal. Pupils are equal, round, and reactive to light. No scleral icterus.  Neck: Normal range of motion. Neck supple. No thyromegaly present.  Cardiovascular: Normal rate, regular rhythm, normal heart sounds and intact distal pulses.   No murmur heard. Pulses:      Radial pulses are 2+ on the right side, and 2+ on the left side.  Pulmonary/Chest: Effort normal and breath sounds normal. No respiratory distress. He has no wheezes. He has no rales.  Abdominal: Soft. Bowel sounds are normal. He exhibits no distension and no mass. There is no tenderness. There is no rebound and no  guarding.  Musculoskeletal: Normal range of motion. He exhibits no edema.  Lymphadenopathy:    He has no cervical adenopathy.  Neurological: He is alert and oriented to person, place, and time.  CN grossly intact, station and gait intact  Skin: Skin is warm and dry. No rash noted.  Psychiatric: He has a  normal mood and affect. His behavior is normal. Judgment and thought content normal.  Nursing note and vitals reviewed.  Results for orders placed or performed in visit on 02/09/16  Lipid panel  Result Value Ref Range   Cholesterol 104 0 - 200 mg/dL   Triglycerides 295.2188.0 (H) 0.0 - 149.0 mg/dL   HDL 84.1327.00 (L) >24.40>39.00 mg/dL   VLDL 10.237.6 0.0 - 72.540.0 mg/dL   LDL Cholesterol 39 0 - 99 mg/dL   Total CHOL/HDL Ratio 4    NonHDL 76.52   Comprehensive metabolic panel  Result Value Ref Range   Sodium 138 135 - 145 mEq/L   Potassium 3.7 3.5 - 5.1 mEq/L   Chloride 106 96 - 112 mEq/L   CO2 26 19 - 32 mEq/L   Glucose, Bld 112 (H) 70 - 99 mg/dL   BUN 12 6 - 23 mg/dL   Creatinine, Ser 3.661.01 0.40 - 1.50 mg/dL   Total Bilirubin 0.4 0.2 - 1.2 mg/dL   Alkaline Phosphatase 78 39 - 117 U/L   AST 31 0 - 37 U/L   ALT 74 (H) 0 - 53 U/L   Total Protein 6.7 6.0 - 8.3 g/dL   Albumin 4.1 3.5 - 5.2 g/dL   Calcium 9.0 8.4 - 44.010.5 mg/dL   GFR 34.7488.21 >25.95>60.00 mL/min  TSH  Result Value Ref Range   TSH 0.56 0.35 - 4.50 uIU/mL  Hemoglobin A1c  Result Value Ref Range   Hgb A1c MFr Bld 5.8 4.6 - 6.5 %      Assessment & Plan:   Problem List Items Addressed This Visit    Hypothyroidism    Chronic, stable. Continue current regimen.      Dyslipidemia    Reviewed diet changes to affect improved lipid control      Prediabetes    Reviewed A1c with patient      Obesity, Class II, BMI 35-39.9, with comorbidity (HCC)    Discussed healthy diet and lifestyle changes to affect sustainable weight loss.      Healthcare maintenance - Primary    Preventative protocols reviewed and updated unless pt declined. Discussed healthy diet and lifestyle.       NASH (nonalcoholic steatohepatitis)    Reviewed with patient. LFTs stable (mild transaminitis). Consider rpt abd US in next 1-2 years. Encouraged weight loss as well as good sugar and cholesterol control to help manage condition. Will need to discuss hep A/B shots at  f/u visit.          Follow up plan: Return in about 1 year (around 02/16/2017), or as needed, for annual exam, prior fasting for blood work.  Eustaquio BoydenJavier Zaire Vanbuskirk, MD

## 2016-02-17 NOTE — Assessment & Plan Note (Signed)
Preventative protocols reviewed and updated unless pt declined. Discussed healthy diet and lifestyle.  

## 2016-02-19 ENCOUNTER — Telehealth: Payer: Self-pay | Admitting: Family Medicine

## 2016-02-19 MED ORDER — LEVOTHYROXINE SODIUM 137 MCG PO TABS: ORAL_TABLET | ORAL | Status: DC

## 2016-02-19 NOTE — Telephone Encounter (Signed)
Patient's wife,Sharon, called back and asked if Maudie Mercury could call in enough medication to [facility] until he gets his medication next Tuesday.  Sharon's number is [phone].

## 2016-02-19 NOTE — Telephone Encounter (Signed)
[facility]

## 2016-02-19 NOTE — Telephone Encounter (Signed)
Pt's spouse called and is requesting 90 day mailorder refill of levothyroxine (SYNTHROID, LEVOTHROID) 137 MCG tablet [[id number]] .  Please advise if this has been completed / faxed.  Pt is requesting callback [phone]

## 2016-02-19 NOTE — Addendum Note (Signed)
Addended by: Josph MachoANCE, Susie Ehresman A on: 02/19/2016 04:59 PM   Modules accepted: Orders

## 2016-02-19 NOTE — Telephone Encounter (Signed)
Rx refilled and wife notified.

## 2016-03-21 ENCOUNTER — Other Ambulatory Visit: Payer: Self-pay | Admitting: Family Medicine

## 2016-06-17 NOTE — Telephone Encounter (Signed)
Antonio Doyle (DPR signed) request refill [facility]rox[facility] to [facility]. [facility] and [facility] rx [facility] and will get ready for pick up in one hr. Antonio Doyle voiced understanding.

## 2016-10-13 ENCOUNTER — Encounter: Payer: Self-pay | Admitting: Family Medicine

## 2016-10-13 ENCOUNTER — Ambulatory Visit (INDEPENDENT_AMBULATORY_CARE_PROVIDER_SITE_OTHER): Payer: BLUE CROSS/BLUE SHIELD | Admitting: Family Medicine

## 2016-10-13 VITALS — BP 108/76 | HR 68 | Temp 97.4°F | Wt 275.0 lb

## 2016-10-13 DIAGNOSIS — H00012 Hordeolum externum right lower eyelid: Secondary | ICD-10-CM

## 2016-10-13 NOTE — Patient Instructions (Signed)
Please do warm compresses several times a day Use lubricating eye drops as needed for dryness or comfort   Stye A stye is a bump on your eyelid caused by a bacterial infection. A stye can form inside the eyelid (internal stye) or outside the eyelid (external stye). An internal stye may be caused by an infected oil-producing gland inside your eyelid. An external stye may be caused by an infection at the base of your eyelash (hair follicle). Styes are very common. Anyone can get them at any age. They usually occur in just one eye, but you may have more than one in either eye. What are the causes? The infection is almost always caused by bacteria called Staphylococcus aureus. This is a common type of bacteria that lives on your skin. What increases the risk? You may be at higher risk for a stye if you have had one before. You may also be at higher risk if you have:  Diabetes.  Long-term illness.  Long-term eye redness.  A skin condition called seborrhea.  High fat levels in your blood (lipids). What are the signs or symptoms? Eyelid pain is the most common symptom of a stye. Internal styes are more painful than external styes. Other signs and symptoms may include:  Painful swelling of your eyelid.  A scratchy feeling in your eye.  Tearing and redness of your eye.  Pus draining from the stye. How is this diagnosed? Your health care provider may be able to diagnose a stye just by examining your eye. The health care provider may also check to make sure:  You do not have a fever or other signs of a more serious infection.  The infection has not spread to other parts of your eye or areas around your eye. How is this treated? Most styes will clear up in a few days without treatment. In some cases, you may need to use antibiotic drops or ointment to prevent infection. Your health care provider may have to drain the stye surgically if your stye is:  Large.  Causing a lot of  pain.  Interfering with your vision. This can be done using a thin blade or a needle. Follow these instructions at home:  Take medicines only as directed by your health care provider.  Apply a clean, warm compress to your eye for 10 minutes, 4 times a day.  Do not wear contact lenses or eye makeup until your stye has healed.  Do not try to pop or drain the stye. Contact a health care provider if:  You have chills or a fever.  Your stye does not go away after several days.  Your stye affects your vision.  Your eyeball becomes swollen, red, or painful. This information is not intended to replace advice given to you by your health care provider. Make sure you discuss any questions you have with your health care provider. Document Released: 06/30/2005 Document Revised: 05/16/2016 Document Reviewed: 01/04/2014 Elsevier Interactive Patient Education  2017 [employer].

## 2016-10-13 NOTE — Progress Notes (Signed)
   Subjective:    Patient ID: Antonio Doyle, male    DOB: 1981/01/30, 36 y.o.   MRN: [id number]  HPI This is a 36 yo male who presents today with 2 eyes of swelling under right eye. Rubbed eye two eyes ago and noticed it was sore. Has noticed some clear watery drainage, applied warm washcloth. Feels like something is pushing on his eye, sinus area feels puffy and swollen. No nasal drainage or congestion, no ear pain, no sore throat, no cough, no fever. Has otherwise felt well.   Past Medical History:  Diagnosis Date  . Dyslipidemia   . GERD (gastroesophageal reflux disease)   . Hypothyroidism   . NAFLD (nonalcoholic fatty liver disease) 40982014   likely NASH - diffuse with hepatomegaly and borderline splenomegaly s/p GI w/u (MRI 2014, [facility])  . Obesity    Past Surgical History:  Procedure Laterality Date  . WISDOM TOOTH EXTRACTION     x 3   Family History  Problem Relation Age of Onset  . Adopted: Yes  . Cancer Maternal Grandfather 75    pancreas  . Cancer Paternal Grandfather [id number]    colon  . Stroke Father [id number]    smoker  . Diabetes Neg Hx   . Hypertension Neg Hx   . CAD Neg Hx   . Colon polyps Neg Hx   . Pulmonary embolism Father    Social History  Substance Use Topics  . Smoking status: Never Smoker  . Smokeless tobacco: Never Used  . Alcohol use No      Review of Systems Per HPI    Objective:   Physical Exam  Constitutional: He is oriented to person, place, and time. He appears well-developed and well-nourished. No distress.  HENT:  Head: Normocephalic and atraumatic.  Nose: Nose normal.  Mouth/Throat: Oropharynx is clear and moist.  Right cheek appears slightly fuller than left, no erythema.   Eyes: Conjunctivae are normal. Pupils are equal, round, and reactive to light. Right eye exhibits hordeolum (interior aspect lower lid). Right eye exhibits no discharge. Left eye exhibits no discharge. Left eye hordeolum: inner right, lower lid.  Cardiovascular: Normal  rate.   Pulmonary/Chest: Effort normal.  Musculoskeletal: Normal range of motion.  Neurological: He is alert and oriented to person, place, and time.  Skin: Skin is warm and dry. He is not diaphoretic.  Psychiatric: He has a normal mood and affect. His behavior is normal. Judgment and thought content normal.  Vitals reviewed.     BP 108/76 (BP Location: Left Arm, Patient Position: Sitting, Cuff Size: Large)   Pulse 68   Temp 97.4 F (36.3 C) (Oral)   Wt 275 lb (124.7 kg)   SpO2 97%   BMI 39.46 kg/m  Wt Readings from Last 3 Encounters:  10/13/16 275 lb (124.7 kg)  02/17/16 272 lb (123.4 kg)  10/15/15 275 lb 8 oz (125 kg)       Assessment & Plan:  Discussed with Dr. Sharen HonesGutierrez who also examined patient 1. Hordeolum externum of right lower eyelid - Provided written and verbal information regarding diagnosis and treatment. - warm compresses several times a day - lubricating eye drops as needed - RTC precautions reviewed   Olean Reeeborah Tashanti Dalporto, FNP-BC  [facility], [facility]  10/13/2016 5:59 PM

## 2016-11-18 LAB — LIPID PANEL
Cholesterol: 103 (ref 0–200)
HDL: 29 — AB (ref 35–70)
LDL Cholesterol: 40
Triglycerides: 169 — AB (ref 40–160)

## 2016-11-18 LAB — HEPATIC FUNCTION PANEL
ALT: 54 — AB (ref 10–40)
AST: 24 (ref 14–40)

## 2016-11-18 LAB — TSH: TSH: 0.94 (ref 0.41–5.90)

## 2016-11-18 LAB — CBC AND DIFFERENTIAL
NEUTROS ABS: 251
WBC: 7

## 2016-11-18 LAB — BASIC METABOLIC PANEL
CREATININE: 1 (ref 0.6–1.3)
GLUCOSE: 97
Potassium: 4.1 (ref 3.4–5.3)
Sodium: 142 (ref 137–147)

## 2016-12-14 ENCOUNTER — Emergency Department
Admission: EM | Admit: 2016-12-14 | Discharge: 2016-12-14 | Disposition: A | Discharge status: Home / Self Care | Payer: BLUE CROSS/BLUE SHIELD | Attending: Emergency Medicine | Admitting: Emergency Medicine

## 2016-12-14 ENCOUNTER — Encounter: Payer: Self-pay | Admitting: Emergency Medicine

## 2016-12-14 ENCOUNTER — Telehealth: Payer: Self-pay | Admitting: *Deleted

## 2016-12-14 DIAGNOSIS — M5441 Lumbago with sciatica, right side: Secondary | ICD-10-CM | POA: Insufficient documentation

## 2016-12-14 DIAGNOSIS — E039 Hypothyroidism, unspecified: Secondary | ICD-10-CM | POA: Diagnosis not present

## 2016-12-14 DIAGNOSIS — M5431 Sciatica, right side: Secondary | ICD-10-CM

## 2016-12-14 DIAGNOSIS — M545 Low back pain: Secondary | ICD-10-CM | POA: Diagnosis present

## 2016-12-14 LAB — URINALYSIS, COMPLETE (UACMP) WITH MICROSCOPIC
BACTERIA UA: NONE SEEN
BILIRUBIN URINE: NEGATIVE
GLUCOSE, UA: NEGATIVE mg/dL
Hgb urine dipstick: NEGATIVE
KETONES UR: NEGATIVE mg/dL
LEUKOCYTES UA: NEGATIVE
NITRITE: NEGATIVE
PH: 5 (ref 5.0–8.0)
Protein, ur: NEGATIVE mg/dL
RBC / HPF: NONE SEEN RBC/hpf (ref 0–5)
SPECIFIC GRAVITY, URINE: 1.008 (ref 1.005–1.030)
Squamous Epithelial / LPF: NONE SEEN
WBC, UA: NONE SEEN WBC/hpf (ref 0–5)

## 2016-12-14 LAB — GLUCOSE, CAPILLARY: Glucose-Capillary: 96 mg/dL (ref 65–99)

## 2016-12-14 MED ORDER — OXYCODONE-ACETAMINOPHEN 5-325 MG PO TABS
1.0000 | ORAL_TABLET | Freq: Once | ORAL | Status: AC
  Administered 2016-12-14: 1 via ORAL
  Filled 2016-12-14: qty 1

## 2016-12-14 MED ORDER — NAPROXEN 375 MG PO TABS: 375.0000 mg | ORAL_TABLET | Freq: Two times a day (BID) | ORAL | 2 refills | Status: DC

## 2016-12-14 NOTE — ED Notes (Signed)
See triage note  states he has had lower back pain for several weeks   Now states pain is moving down right leg  Ambulates slowly to treatment room

## 2016-12-14 NOTE — ED Triage Notes (Signed)
C/O right lower back pain radiating down right leg.  States has had right low back pain x 1 month, but yesterday morning pain radiated down right leg.

## 2016-12-14 NOTE — ED Notes (Signed)

## 2016-12-14 NOTE — Telephone Encounter (Signed)
Patient's wife left a voicemail stating that he went to the ER today and was diagnosed with sciatica. The ER doctor wants to refer her husband [facility]. Patient's wife stated that they would like to stay in [facility] if possible. Mrs. Antonio Doyle stated that patient is is pain, but they sent him home with a script for Naproxen. Patient's wife stated that he needs to see someone soon and she wants to know if they should go ahead and go [facility] or can you recommend someone?

## 2016-12-14 NOTE — ED Provider Notes (Addendum)
[facility] Emergency Department Provider Note  ____________________________________________   I have reviewed the triage vital signs and the nursing notes.   HISTORY  Chief Complaint Back Pain    HPI Antonio Doyle is a 36 y.o. male a history of obesity, and thyroid issues presents today with several different complaints. The first is that last month he has had pain from his back going down towards his hip and now it is radiating all the way down his leg. It's a sharp "lightening-like" sensation is worse when he raises his leg. No numbness no weakness no incontinence of bowel or bladder. The patient states that he has had some mild degree of urinary frequency but certainly no evidence of neurogenic bladder.He denies any recent trauma, he denies any flank pain he denies dysuria or urinary symptoms otherwise.    Past Medical History:  Diagnosis Date  . Dyslipidemia   . GERD (gastroesophageal reflux disease)   . Hypothyroidism   . NAFLD (nonalcoholic fatty liver disease) 2014   likely NASH - diffuse with hepatomegaly and borderline splenomegaly s/p GI w/u (MRI 2014, [facility])  . Obesity     Patient Active Problem List   Diagnosis Date Noted  . NASH (nonalcoholic steatohepatitis) 11/12/2012  . Healthcare maintenance 10/11/2012  . Obesity, Class II, BMI 35-39.9, with comorbidity ([place])   . Dyslipidemia 02/08/2008  . Hypothyroidism 02/07/2008  . Prediabetes 02/07/2008    Past Surgical History:  Procedure Laterality Date  . WISDOM TOOTH EXTRACTION     x 3    Prior to Admission medications   Medication Sig Start Date End Date Taking? Authorizing Provider  levothyroxine (SYNTHROID, LEVOTHROID) 137 MCG tablet TAKE ONE TABLET BY MOUTH ONE TIME DAILY 03/22/16   Ria Bush, MD    Allergies Eggs or egg-derived products  Family History  Problem Relation Age of Onset  . Adopted: Yes  . Cancer Maternal Grandfather 75    pancreas  . Cancer Paternal  Grandfather 9    colon  . Stroke Father 1    smoker  . Pulmonary embolism Father   . Diabetes Neg Hx   . Hypertension Neg Hx   . CAD Neg Hx   . Colon polyps Neg Hx     Social History Social History  Substance Use Topics  . Smoking status: Never Smoker  . Smokeless tobacco: Never Used  . Alcohol use No    Review of Systems Constitutional: No fever/chills Eyes: No visual changes. ENT: No sore throat. No stiff neck no neck pain Cardiovascular: Denies chest pain. Respiratory: Denies shortness of breath. Gastrointestinal:   no vomiting.  No diarrhea.  No constipation. Genitourinary: Negative for dysuria. Musculoskeletal: Negative lower extremity swelling Skin: Negative for rash. Neurological: Negative for severe headaches, focal weakness or numbness. 10-point ROS otherwise negative.  ____________________________________________   PHYSICAL EXAM:  VITAL SIGNS: ED Triage Vitals  Enc Vitals Group     BP 12/14/16 0906 135/84     Pulse Rate 12/14/16 0906 64     Resp 12/14/16 0906 16     Temp 12/14/16 0906 98.9 F (37.2 C)     Temp Source 12/14/16 0906 Oral     SpO2 12/14/16 0906 96 %     Weight 12/14/16 0905 272 lb (123.4 kg)     Height 12/14/16 0905 _0  (1.803 m)     Head Circumference --      Peak Flow --      Pain Score 12/14/16 0905 7  Pain Loc --      Pain Edu? --      Excl. in [place]? --     Constitutional: Alert and oriented. Well appearing and in no acute distress. Eyes: Conjunctivae are normal. PERRL. EOMI. Head: Atraumatic. Nose: No congestion/rhinnorhea. Mouth/Throat: Mucous membranes are moist.  Oropharynx non-erythematous. Neck: No stridor.   Nontender with no meningismus Cardiovascular: Normal rate, regular rhythm. Grossly normal heart sounds.  Good peripheral circulation. Respiratory: Normal respiratory effort.  No retractions. Lungs CTAB. Abdominal: Soft and nontender. No distention. No guarding no rebound Back:  There is some minimal  tenderness to palpation in the sciatic fossa as well as in the right paraspinal muscles but no midline tenderness  there is no midline tenderness there are no lesions noted. there is no CVA tenderness No saddle anesthesia Musculoskeletal: No lower extremity tenderness, no upper extremity tenderness. No joint effusions, no DVT signs strong distal pulses no edema Neurologic:  Normal speech and language. No gross focal neurologic deficits are appreciated. Patient has intact straight leg raise bilaterally with good strength however he does have positive straight leg raise on the right frequent degrees which does he produce his symptoms causing him to have pain shooting down his leg he states Skin:  Skin is warm, dry and intact. No rash noted. Psychiatric: Mood and affect are normal. Speech and behavior are normal.  ____________________________________________   LABS (all labs ordered are listed, but only abnormal results are displayed)  Labs Reviewed  URINALYSIS, COMPLETE (UACMP) WITH MICROSCOPIC  CBG MONITORING, ED   ____________________________________________  EKG  I personally interpreted any EKGs ordered by me or triage  ____________________________________________  RADIOLOGY  I reviewed any imaging ordered by me or triage that were performed during my shift and, if possible, patient and/or family made aware of any abnormal findings. ____________________________________________   PROCEDURES  Procedure(s) performed: None  Procedures  Critical Care performed: None  ____________________________________________   INITIAL IMPRESSION / ASSESSMENT AND PLAN / ED COURSE  Pertinent labs & imaging results that were available during my care of the patient were reviewed by me and considered in my medical decision making (see chart for details).  Patient was classic sciatic symptoms with no evidence of acute decompensated neurologic pathology such as cauda equina syndrome. No "red  flags" to suggest that the patient's for example has a significant structural abnormality in his back such as metastatic cancer. Nothing to suggest AAA. Nothing to suggest impending neurologic or chest free. There is also no evidence of vascular pathology such as peripheral vascular disease. Strong pulses, he has very positional leg pain. We'll give him conventional treatment with NSAIDs and pain modification. We will also because he states over the last few weeks he has been urinating more frequently, obtain urinalysis and blood glucose. I do not at this time feel the patient is having cauda equina symptoms.   ----------------------------------------- 11:32 AM on 12/14/2016 -----------------------------------------  And feels much better after the Percocet he is ambulating in the room with no difficulty, as he was when I first met him. He has intact push pull with no evidence of numbness or weakness at this time neurologic exam remains completely normal aside from a radicular pathology of pain that shoots down his legs with straight leg raise. No indication for acute MRI. He declines rectal examination is no saddle anesthesia. We discussed obtaining an MRI and he feels very comfortable following up as an outpatient. We will send him home with pain medication, work note and close  outpatient follow-up with neurosurgery. Patient will come back if he feels worse in any way he understands the red flags C muscle For specially numbness and weakness. He and family are very comfortable with this plan and he is not driving home. ____________________________________________   FINAL CLINICAL IMPRESSION(S) / ED DIAGNOSES  Final diagnoses:  None      This chart was dictated using voice recognition software.  Despite best efforts to proofread,  errors can occur which can change meaning.      Schuyler Amor, MD 12/14/16 1033    Schuyler Amor, MD 12/14/16 [phone]

## 2016-12-15 NOTE — Telephone Encounter (Signed)
I don't know if there's neursurg in [facility] available in [place].  Will place referral to [facility] [facility] if they desire to go there, otherwise ok to go [facility].

## 2016-12-15 NOTE — Telephone Encounter (Signed)
Faxing info [facility]  Pt is aware they will be calling him to contact to schedule

## 2016-12-25 NOTE — Telephone Encounter (Signed)
Pt decided not to go to neurosurgery at this time. Referral cancelled.

## 2017-04-17 ENCOUNTER — Other Ambulatory Visit: Payer: Self-pay | Admitting: Family Medicine

## 2017-06-02 ENCOUNTER — Encounter: Payer: Self-pay | Admitting: Family Medicine

## 2017-06-02 ENCOUNTER — Ambulatory Visit (INDEPENDENT_AMBULATORY_CARE_PROVIDER_SITE_OTHER): Payer: BLUE CROSS/BLUE SHIELD | Admitting: Family Medicine

## 2017-06-02 VITALS — BP 120/78 | HR 72 | Temp 98.0°F | Ht 70.0 in | Wt 272.5 lb

## 2017-06-02 DIAGNOSIS — E669 Obesity, unspecified: Secondary | ICD-10-CM | POA: Diagnosis not present

## 2017-06-02 DIAGNOSIS — E785 Hyperlipidemia, unspecified: Secondary | ICD-10-CM | POA: Diagnosis not present

## 2017-06-02 DIAGNOSIS — E039 Hypothyroidism, unspecified: Secondary | ICD-10-CM

## 2017-06-02 DIAGNOSIS — R7303 Prediabetes: Secondary | ICD-10-CM | POA: Diagnosis not present

## 2017-06-02 DIAGNOSIS — Z0000 Encounter for general adult medical examination without abnormal findings: Secondary | ICD-10-CM

## 2017-06-02 DIAGNOSIS — IMO0001 Reserved for inherently not codable concepts without codable children: Secondary | ICD-10-CM

## 2017-06-02 DIAGNOSIS — K7581 Nonalcoholic steatohepatitis (NASH): Secondary | ICD-10-CM | POA: Diagnosis not present

## 2017-06-02 MED ORDER — LEVOTHYROXINE SODIUM 137 MCG PO TABS: 137.0000 ug | ORAL_TABLET | Freq: Every day | ORAL | 3 refills | Status: DC

## 2017-06-02 NOTE — Assessment & Plan Note (Signed)
Glu slightly elevated at work labs. Encouraged avoiding added sugar in diet.

## 2017-06-02 NOTE — Assessment & Plan Note (Signed)
Preventative protocols reviewed and updated unless pt declined. Discussed healthy diet and lifestyle.  

## 2017-06-02 NOTE — Assessment & Plan Note (Signed)
Discussed healthy diet and lifestyle changes to affect sustainable weight loss. He is motivated to continue regular working out at gym at work.

## 2017-06-02 NOTE — Assessment & Plan Note (Addendum)
Reviewed with patient. Discussed pathophysiology and treatment recommendations for NASH.  He will check with wife about Hep A/B shots - these should be safe in egg allergy. Advised ok to call for nurse visit if desired.

## 2017-06-02 NOTE — Assessment & Plan Note (Signed)
Reviewed with patient. Off meds. Encouraged healthy diet changes and increased aerobic exercise to help control lipid levels.  The ASCVD Risk score Denman George(Goff DC Jr., et al., 2013) failed to calculate for the following reasons:   The 2013 ASCVD risk score is only valid for ages 2540 to 5179

## 2017-06-02 NOTE — Progress Notes (Signed)
BP 120/78   Pulse 72   Temp 98 F (36.7 C) (Oral)   Ht 5\' 10"  (1.778 m)   Wt 272 lb 8 oz (123.6 kg)   SpO2 96%   BMI 39.10 kg/m    CC: CPE Subjective:    Patient ID: Antonio Doyle, male    DOB: 08/21/1981, 36 y.o.   MRN: [id number]  HPI: Antonio Doyle is a 36 y.o. male presenting on 06/02/2017 for Annual Exam and Nasal Congestion   Got laid off last year - now works in Educational psychologist at [employer].   Hypothyroidism, NASH, prediabetes.  Episode of R sided sciatica seen at ER - felt back tweaked while on elliptical. He has started riding bike and running treadmill.   Viral URI for the past week - rhinorrhea, sore throat, congestion, chills. Fever last week. This is improving. Out of work a few days.   Preventative: Flu allergic to eggs  Td ~2010  Seat belt use discussed.  Sunscreen use discussed. No changing moles on skin.  Non smoker Alcohol - none   Caffeine: 1 mt dew daily Lives with wife and 2 daughters, 2 dogs  Occupation: [employer]: college  Activity: working out at work gym regularly Diet: good water, some mt dew daily, fruits/vegetables daily   Relevant past medical, surgical, family and social history reviewed and updated as indicated. Interim medical history since our last visit reviewed. Allergies and medications reviewed and updated. Outpatient Medications Prior to Visit  Medication Sig Dispense Refill  . levothyroxine (SYNTHROID, LEVOTHROID) 137 MCG tablet Take 1 tablet (137 mcg total) by mouth daily. NEEDS ANNUAL PHYSICAL 90 tablet 0  . naproxen (NAPROSYN) 375 MG tablet Take 1 tablet (375 mg total) by mouth 2 (two) times daily with a meal. 30 tablet 2   No facility-administered medications prior to visit.      Per HPI unless specifically indicated in ROS section below Review of Systems  Constitutional: Positive for fever. Negative for activity change, appetite change, chills, fatigue and unexpected weight change.  HENT: Positive  for congestion, rhinorrhea and sore throat. Negative for hearing loss.        Recent URI  Eyes: Negative for visual disturbance.  Respiratory: Positive for cough (recent URI). Negative for chest tightness, shortness of breath and wheezing.   Cardiovascular: Negative for chest pain, palpitations and leg swelling.  Gastrointestinal: Negative for abdominal distention, abdominal pain, blood in stool, constipation, diarrhea, nausea and vomiting.  Genitourinary: Negative for difficulty urinating and hematuria.  Musculoskeletal: Negative for arthralgias, myalgias and neck pain.  Skin: Negative for rash.  Neurological: Negative for dizziness, seizures, syncope and headaches.  Hematological: Negative for adenopathy. Does not bruise/bleed easily.  Psychiatric/Behavioral: Negative for dysphoric mood. The patient is not nervous/anxious.        Objective:    BP 120/78   Pulse 72   Temp 98 F (36.7 C) (Oral)   Ht 5\' 10"  (1.778 m)   Wt 272 lb 8 oz (123.6 kg)   SpO2 96%   BMI 39.10 kg/m   Wt Readings from Last 3 Encounters:  06/02/17 272 lb 8 oz (123.6 kg)  12/14/16 272 lb (123.4 kg)  10/13/16 275 lb (124.7 kg)    Physical Exam  Constitutional: He is oriented to person, place, and time. He appears well-developed and well-nourished. No distress.  HENT:  Head: Normocephalic and atraumatic.  Right Ear: Hearing, tympanic membrane, external ear and ear canal normal.  Left Ear: Hearing, tympanic  membrane, external ear and ear canal normal.  Nose: Nose normal.  Mouth/Throat: Uvula is midline, oropharynx is clear and moist and mucous membranes are normal. No oropharyngeal exudate, posterior oropharyngeal edema or posterior oropharyngeal erythema.  Eyes: Pupils are equal, round, and reactive to light. Conjunctivae and EOM are normal. No scleral icterus.  Neck: Normal range of motion. Neck supple. No thyromegaly present.  Cardiovascular: Normal rate, regular rhythm, normal heart sounds and intact  distal pulses.   No murmur heard. Pulses:      Radial pulses are 2+ on the right side, and 2+ on the left side.  Pulmonary/Chest: Effort normal and breath sounds normal. No respiratory distress. He has no wheezes. He has no rales.  Abdominal: Soft. Bowel sounds are normal. He exhibits no distension and no mass. There is no tenderness. There is no rebound and no guarding.  Musculoskeletal: Normal range of motion. He exhibits no edema.  Lymphadenopathy:    He has no cervical adenopathy.  Neurological: He is alert and oriented to person, place, and time.  CN grossly intact, station and gait intact  Skin: Skin is warm and dry. No rash noted.  Psychiatric: He has a normal mood and affect. His behavior is normal. Judgment and thought content normal.  Nursing note and vitals reviewed.      Assessment & Plan:  Encouraged he return for egg-free influenza shot. He may start combo Hep A/B series at that time if desired.  Work form filled out.  Problem List Items Addressed This Visit    Dyslipidemia    Reviewed with patient. Off meds. Encouraged healthy diet changes and increased aerobic exercise to help control lipid levels.  The ASCVD Risk score Denman George(Goff DC Jr., et al., 2013) failed to calculate for the following reasons:   The 2013 ASCVD risk score is only valid for ages 3540 to 379       Healthcare maintenance - Primary    Preventative protocols reviewed and updated unless pt declined. Discussed healthy diet and lifestyle.       Hypothyroidism    Chronic, stable. Continue current regimen.       Relevant Medications   levothyroxine (SYNTHROID, LEVOTHROID) 137 MCG tablet   NASH (nonalcoholic steatohepatitis)    Reviewed with patient. Discussed pathophysiology and treatment recommendations for NASH.  He will check with wife about Hep A/B shots - these should be safe in egg allergy. Advised ok to call for nurse visit if desired.       Obesity, Class II, BMI 35-39.9, with comorbidity ([facility])      Discussed healthy diet and lifestyle changes to affect sustainable weight loss. He is motivated to continue regular working out at gym at work.       Prediabetes    Glu slightly elevated at work labs. Encouraged avoiding added sugar in diet.           Follow up plan: Return in about 1 year (around 06/02/2018) for annual exam, prior fasting for blood work.  Eustaquio BoydenJavier Ferguson Gertner, MD

## 2017-06-02 NOTE — Patient Instructions (Addendum)
Think about hepatitis A and B shots.  Come back in for egg-free flu shot - or get at local pharmacy and let us know.  Viral respiratory infection should continue to improve each day - push fluids and rest.  Keep working on regular exercise regimen for goal weight loss. This will also help control fatty liver.  Return as needed or in 1 year for next physical.  Health Maintenance, Male A healthy lifestyle and preventive care is important for your health and wellness. Ask your health care provider about what schedule of regular examinations is right for you. What should I know about weight and diet? Eat a Healthy Diet  Eat plenty of vegetables, fruits, whole grains, low-fat dairy products, and lean protein.  Do not eat a lot of foods high in solid fats, added sugars, or salt.  Maintain a Healthy Weight Regular exercise can help you achieve or maintain a healthy weight. You should:  Do at least 150 minutes of exercise each week. The exercise should increase your heart rate and make you sweat (moderate-intensity exercise).  Do strength-training exercises at least twice a week.  Watch Your Levels of Cholesterol and Blood Lipids  Have your blood tested for lipids and cholesterol every 5 years starting at 38 years of age. If you are at high risk for heart disease, you should start having your blood tested when you are 38 years old. You may need to have your cholesterol levels checked more often if: ? Your lipid or cholesterol levels are high. ? You are older than 38 years of age. ? You are at high risk for heart disease.  What should I know about cancer screening? Many types of cancers can be detected early and may often be prevented. Lung Cancer  You should be screened every year for lung cancer if: ? You are a current smoker who has smoked for at least 30 years. ? You are a former smoker who has quit within the past 15 years.  Talk to your health care provider about your screening  options, when you should start screening, and how often you should be screened.  Colorectal Cancer  Routine colorectal cancer screening usually begins at 38 years of age and should be repeated every 5-10 years until you are 38 years old. You may need to be screened more often if early forms of precancerous polyps or small growths are found. Your health care provider may recommend screening at an earlier age if you have risk factors for colon cancer.  Your health care provider may recommend using home test kits to check for hidden blood in the stool.  A small camera at the end of a tube can be used to examine your colon (sigmoidoscopy or colonoscopy). This checks for the earliest forms of colorectal cancer.  Prostate and Testicular Cancer  Depending on your age and overall health, your health care provider may do certain tests to screen for prostate and testicular cancer.  Talk to your health care provider about any symptoms or concerns you have about testicular or prostate cancer.  Skin Cancer  Check your skin from head to toe regularly.  Tell your health care provider about any new moles or changes in moles, especially if: ? There is a change in a mole's size, shape, or color. ? You have a mole that is larger than a pencil eraser.  Always use sunscreen. Apply sunscreen liberally and repeat throughout the day.  Protect yourself by wearing long sleeves, pants, a  wide-brimmed hat, and sunglasses when outside.  What should I know about heart disease, diabetes, and high blood pressure?  If you are 60-70 years of age, have your blood pressure checked every 3-5 years. If you are 8 years of age or older, have your blood pressure checked every year. You should have your blood pressure measured twice-once when you are at a hospital or clinic, and once when you are not at a hospital or clinic. Record the average of the two measurements. To check your blood pressure when you are not at a hospital  or clinic, you can use: ? An automated blood pressure machine at a pharmacy. ? A home blood pressure monitor.  Talk to your health care provider about your target blood pressure.  If you are between 51-107 years old, ask your health care provider if you should take aspirin to prevent heart disease.  Have regular diabetes screenings by checking your fasting blood sugar level. ? If you are at a normal weight and have a low risk for diabetes, have this test once every three years after the age of 75. ? If you are overweight and have a high risk for diabetes, consider being tested at a younger age or more often.  A one-time screening for abdominal aortic aneurysm (AAA) by ultrasound is recommended for men aged 87-75 years who are current or former smokers. What should I know about preventing infection? Hepatitis B If you have a higher risk for hepatitis B, you should be screened for this virus. Talk with your health care provider to find out if you are at risk for hepatitis B infection. Hepatitis C Blood testing is recommended for:  Everyone born from [birth year] through 1967.  Anyone with known risk factors for hepatitis C.  Sexually Transmitted Diseases (STDs)  You should be screened each year for STDs including gonorrhea and chlamydia if: ? You are sexually active and are younger than 38 years of age. ? You are older than 38 years of age and your health care provider tells you that you are at risk for this type of infection. ? Your sexual activity has changed since you were last screened and you are at an increased risk for chlamydia or gonorrhea. Ask your health care provider if you are at risk.  Talk with your health care provider about whether you are at high risk of being infected with HIV. Your health care provider may recommend a prescription medicine to help prevent HIV infection.  What else can I do?  Schedule regular health, dental, and eye exams.  Stay current with your vaccines  (immunizations).  Do not use any tobacco products, such as cigarettes, chewing tobacco, and e-cigarettes. If you need help quitting, ask your health care provider.  Limit alcohol intake to no more than 2 drinks per day. One drink equals 12 ounces of beer, 5 ounces of wine, or 1 ounces of hard liquor.  Do not use street drugs.  Do not share needles.  Ask your health care provider for help if you need support or information about quitting drugs.  Tell your health care provider if you often feel depressed.  Tell your health care provider if you have ever been abused or do not feel safe at home. This information is not intended to replace advice given to you by your health care provider. Make sure you discuss any questions you have with your health care provider. Document Released: 03/18/2008 Document Revised: 05/19/2016 Document Reviewed: 06/24/2015 Elsevier Interactive Patient  Education  2018 Elsevier Inc.  

## 2017-06-02 NOTE — Assessment & Plan Note (Signed)
Chronic, stable. Continue current regimen. 

## 2017-06-13 ENCOUNTER — Encounter: Payer: Self-pay | Admitting: Family Medicine

## 2017-10-20 ENCOUNTER — Other Ambulatory Visit: Payer: Self-pay

## 2017-10-20 MED ORDER — LEVOTHYROXINE SODIUM 137 MCG PO TABS: 137.0000 ug | ORAL_TABLET | Freq: Every day | ORAL | 2 refills | Status: DC

## 2017-10-25 ENCOUNTER — Telehealth: Payer: Self-pay | Admitting: Family Medicine

## 2017-10-25 DIAGNOSIS — E039 Hypothyroidism, unspecified: Secondary | ICD-10-CM

## 2017-10-25 NOTE — Telephone Encounter (Signed)
Copied from [place] [id number]. Topic: Quick Communication - See Telephone Encounter >> Oct 25, 2017 12:11 PM Ether Griffins B wrote: CRM for notification. See Telephone encounter for:  [facility] calling to verify it is ok to switch pts medication levothyroxine to a different manufacture. [phone] 10/25/17.

## 2017-10-25 NOTE — Telephone Encounter (Signed)
Pt is calling stating he has been out of medication since Sat. 10/22/17.

## 2017-10-26 ENCOUNTER — Telehealth: Payer: Self-pay | Admitting: Family Medicine

## 2017-10-26 NOTE — Telephone Encounter (Signed)
Copied from [place]. Topic: Quick Communication - See Telephone Encounter >> Oct 26, 2017 10:40 AM Ether Griffins B wrote: CRM for notification. See Telephone encounter for:  Elmyra Ricks with [facility] calling for an ok to switch the levothyroxine to another brand due to the normal one being out of stock. Switching it to mylan call back [phone] 10/26/17.

## 2017-10-26 NOTE — Telephone Encounter (Signed)
plz notify pharmacy - ok to switch formulary.  plz notify pt to come in 6 wks after starting new pill for lab visit only for TSH check to ensure stable readings. Labs ordered.

## 2017-10-26 NOTE — Telephone Encounter (Signed)
Already resolved. See TE, 10/25/17. 

## 2017-10-26 NOTE — Telephone Encounter (Signed)
[facility] [address], relayed message, per Dr. Reece AgarG, that it's ok to switch formulary.  Says ok and they will fill for pt.  Spoke with pt, says he will start new pill today. Scheduled TSH lab visit for 12/08/17 at 8:15 AM.

## 2017-10-26 NOTE — Telephone Encounter (Signed)
Copied from CRM [phone]. Topic: General - Other >> Oct 26, 2017 12:08 PM Cecelia ByarsGreen, Dimitri Shakespeare L, RMA wrote: Reason for CRM: [facility] is calling requesting permission to change manufacturer for Levothyroxine, due to [employer] shortage, pt states its ok with him if provider says it is ok

## 2017-10-27 ENCOUNTER — Telehealth: Payer: Self-pay

## 2017-10-27 NOTE — Telephone Encounter (Signed)
Copied from CRM [phone]. Topic: Quick Communication - See Telephone Encounter >> Oct 25, 2017 12:11 PM Diana EvesHoyt, Maryann B wrote: CRM for notification. See Telephone encounter for:  [facility] calling to verify it is ok to switch pts medication levothyroxine to a different manufacture. [phone] 10/25/17. >> Oct 25, 2017  5:55 PM Windy KalataMichael, Taylor L, NT wrote: Patient is calling and is upset because this has not been handled yet. States he has been out of his medication since 10/22/17 and also says he will be at the office in the morning at 7:30am until this is handled. Please advise.

## 2017-10-27 NOTE — Telephone Encounter (Signed)
Copied from CRM [id number]. Topic: Inquiry >> Oct 25, 2017  5:56 PM Windy KalataMichael, Taylor L, NT wrote: Patient is calling and is upset because his medication has not been handled yet. States he has been out of his medication since 10/22/17 and also says he will be at the office in the morning at 7:30am until this is handled. Please advise.

## 2017-10-27 NOTE — Telephone Encounter (Signed)
Already resolved. See TE, 10/25/17. 

## 2017-10-27 NOTE — Telephone Encounter (Signed)
Already resolved. See TE, 10/25/17.

## 2017-10-27 NOTE — Telephone Encounter (Signed)
Copied from CRM #[id number]. Topic: Quick Communication - See Telephone Encounter >> Oct 25, 2017 12:11 PM Hoyt, Maryann B wrote: CRM for notification. See Telephone encounter for:  [facility] calling to verify it is ok to switch pts medication levothyroxine to a different manufacture. [phone] 10/25/17. >> Oct 25, 2017  5:55 PM Michael, Taylor L, NT wrote: Patient is calling and is upset because this has not been handled yet. States he has been out of his medication since 10/22/17 and also says he will be at the office in the morning at 7:30am until this is handled. Please advise. 

## 2017-12-08 ENCOUNTER — Other Ambulatory Visit (INDEPENDENT_AMBULATORY_CARE_PROVIDER_SITE_OTHER): Payer: BLUE CROSS/BLUE SHIELD

## 2017-12-08 DIAGNOSIS — E039 Hypothyroidism, unspecified: Secondary | ICD-10-CM | POA: Diagnosis not present

## 2017-12-08 NOTE — Addendum Note (Signed)
Addended by: Ellamae Sia on: 12/08/2017 08:36 AM   Modules accepted: Orders

## 2017-12-09 LAB — TSH: TSH: 0.626 u[IU]/mL (ref 0.450–4.500)

## 2017-12-09 LAB — T4, FREE: Free T4: 1.45 ng/dL (ref 0.82–1.77)

## 2018-01-06 LAB — BASIC METABOLIC PANEL
CREATININE: 0.9 (ref 0.6–1.3)
Glucose: 90
Potassium: 4.8 (ref 3.4–5.3)
Sodium: 139 (ref 137–147)

## 2018-01-06 LAB — HEPATIC FUNCTION PANEL
ALK PHOS: 87 (ref 25–125)
ALT: 38 (ref 10–40)
AST: 20 (ref 14–40)
Bilirubin, Total: 0.4

## 2018-01-06 LAB — LIPID PANEL
CHOLESTEROL: 99 (ref 0–200)
HDL: 31 — AB (ref 35–70)
LDL Cholesterol: 41
TRIGLYCERIDES: 135 (ref 40–160)

## 2018-01-06 LAB — CBC AND DIFFERENTIAL
HEMOGLOBIN: 14.6 (ref 13.5–17.5)
Platelets: 243 (ref 150–399)
WBC: 5.2

## 2018-01-06 LAB — TSH: TSH: 0.94 (ref 0.41–5.90)

## 2018-01-12 ENCOUNTER — Encounter: Payer: Self-pay | Admitting: Family Medicine

## 2018-01-13 ENCOUNTER — Encounter: Payer: Self-pay | Admitting: Family Medicine

## 2018-07-17 ENCOUNTER — Encounter: Payer: Self-pay | Admitting: Family Medicine

## 2018-07-17 NOTE — Progress Notes (Signed)
BP 118/82 (BP Location: Left Arm, Patient Position: Sitting, Cuff Size: Large)   Pulse 67   Temp 98.2 F (36.8 C) (Oral)   Ht 5' 10.25" (1.784 m)   Wt 260 lb 4 oz (118 kg)   SpO2 98%   BMI 37.08 kg/m    CC: CPE Subjective:    Patient ID: Antonio Doyle, male    DOB: 01-25-1981, 37 y.o.   MRN: [id number]  HPI: Antonio Doyle is a 37 y.o. male presenting on 07/18/2018 for Annual Exam   Hypothyroidism, NASH, prediabetes.  Labs at work 01/2018 reviewed.  Mainly drinks water.   Preventative: Flu allergic to eggs  Td ~2010  Seat belt use discussed.  Sunscreen use discussed. No changing moles on skin.  Non smoker  Alcohol - none  Dentist yearly Eye exam - not recently   Caffeine: 1 mt dew daily Lives with wife and 2 daughters, 2 dogs  Occupation: [employer]: college  Activity: working out at work gym 4d/wk  Diet: good water, fruits/vegetables daily   Relevant past medical, surgical, family and social history reviewed and updated as indicated. Interim medical history since our last visit reviewed. Allergies and medications reviewed and updated. Outpatient Medications Prior to Visit  Medication Sig Dispense Refill  . levothyroxine (SYNTHROID, LEVOTHROID) 137 MCG tablet Take 1 tablet (137 mcg total) by mouth daily. 90 tablet 2   No facility-administered medications prior to visit.      Per HPI unless specifically indicated in ROS section below Review of Systems  Constitutional: Negative for activity change, appetite change, chills, fatigue, fever and unexpected weight change.  HENT: Negative for hearing loss.   Eyes: Negative for visual disturbance.  Respiratory: Negative for cough, chest tightness, shortness of breath and wheezing.   Cardiovascular: Negative for chest pain, palpitations and leg swelling.  Gastrointestinal: Negative for abdominal distention, abdominal pain, blood in stool, constipation, diarrhea, nausea and vomiting.  Genitourinary:  Negative for difficulty urinating and hematuria.  Musculoskeletal: Negative for arthralgias, myalgias and neck pain.  Skin: Negative for rash.  Neurological: Negative for dizziness, seizures, syncope and headaches.  Hematological: Negative for adenopathy. Does not bruise/bleed easily.  Psychiatric/Behavioral: Negative for dysphoric mood. The patient is not nervous/anxious.        Objective:    BP 118/82 (BP Location: Left Arm, Patient Position: Sitting, Cuff Size: Large)   Pulse 67   Temp 98.2 F (36.8 C) (Oral)   Ht 5' 10.25" (1.784 m)   Wt 260 lb 4 oz (118 kg)   SpO2 98%   BMI 37.08 kg/m   Wt Readings from Last 3 Encounters:  07/18/18 260 lb 4 oz (118 kg)  06/02/17 272 lb 8 oz (123.6 kg)  12/14/16 272 lb (123.4 kg)    Physical Exam  Constitutional: He is oriented to person, place, and time. He appears well-developed and well-nourished. No distress.  HENT:  Head: Normocephalic and atraumatic.  Right Ear: Hearing, tympanic membrane, external ear and ear canal normal.  Left Ear: Hearing, tympanic membrane, external ear and ear canal normal.  Nose: Nose normal.  Mouth/Throat: Uvula is midline, oropharynx is clear and moist and mucous membranes are normal. No oropharyngeal exudate, posterior oropharyngeal edema or posterior oropharyngeal erythema.  Eyes: Pupils are equal, round, and reactive to light. Conjunctivae and EOM are normal. No scleral icterus.  Neck: Normal range of motion. Neck supple. No thyromegaly present.  Cardiovascular: Normal rate, regular rhythm, normal heart sounds and intact distal pulses.  No murmur heard. Pulses:      Radial pulses are 2+ on the right side, and 2+ on the left side.  Pulmonary/Chest: Effort normal and breath sounds normal. No respiratory distress. He has no wheezes. He has no rales.  Abdominal: Soft. Bowel sounds are normal. He exhibits no distension and no mass. There is no tenderness. There is no rebound and no guarding.  Musculoskeletal:  Normal range of motion. He exhibits no edema.  Lymphadenopathy:    He has no cervical adenopathy.  Neurological: He is alert and oriented to person, place, and time.  CN grossly intact, station and gait intact  Skin: Skin is warm and dry. No rash noted.  Psychiatric: He has a normal mood and affect. His behavior is normal. Judgment and thought content normal.  Nursing note and vitals reviewed.  Results for orders placed or performed in visit on 01/13/18  CBC and differential  Result Value Ref Range   Hemoglobin 14.6 13.5 - 17.5   Platelets 243 150 - 399   WBC 5.2   Basic metabolic panel  Result Value Ref Range   Glucose 90    Creatinine 0.9 0.6 - 1.3   Potassium 4.8 3.4 - 5.3   Sodium 139 137 - 147  Lipid panel  Result Value Ref Range   Triglycerides 135 40 - 160   Cholesterol 99 0 - 200   HDL 31 (A) 35 - 70   LDL Cholesterol 41   Hepatic function panel  Result Value Ref Range   Alkaline Phosphatase 87 25 - 125   ALT 38 10 - 40   AST 20 14 - 40   Bilirubin, Total 0.4   TSH  Result Value Ref Range   TSH 0.94 0.41 - 5.90   Lab Results  Component Value Date   HGBA1C 5.8 02/09/2016       Assessment & Plan:   Problem List Items Addressed This Visit    Severe obesity (BMI 35.0-35.9 with comorbidity) (HCC)    Congratulated on noted weight loss. Discussed healthy diet and lifestyle changes encouraged continued efforts at sustainable weight loss.       Prediabetes    Update A1c today. Anticipate improved control based on weight loss noted.      Relevant Orders   POCT glycosylated hemoglobin (Hb A1C)   NASH (nonalcoholic steatohepatitis)    Recent LFTs stable. Continue to monitor.       Hypothyroidism    Chronic, stable, asxs. Continue current regimen refilled today.       Relevant Medications   levothyroxine (SYNTHROID, LEVOTHROID) 137 MCG tablet   Healthcare maintenance - Primary    Preventative protocols reviewed and updated unless pt declined. Discussed  healthy diet and lifestyle.       Dyslipidemia    Mild - predominantly low HDL. Encouraged continued aerobic exercise.           Meds ordered this encounter  Medications  . levothyroxine (SYNTHROID, LEVOTHROID) 137 MCG tablet    Sig: Take 1 tablet (137 mcg total) by mouth daily.    Dispense:  90 tablet    Refill:  3   Orders Placed This Encounter  Procedures  . POCT glycosylated hemoglobin (Hb A1C)    Follow up plan: Return in about 1 year (around 07/19/2019) for annual exam, prior fasting for blood work.  Eustaquio Boyden, MD

## 2018-07-17 NOTE — Assessment & Plan Note (Signed)
Preventative protocols reviewed and updated unless pt declined. Discussed healthy diet and lifestyle.  

## 2018-07-18 ENCOUNTER — Encounter: Payer: Self-pay | Admitting: Family Medicine

## 2018-07-18 ENCOUNTER — Ambulatory Visit (INDEPENDENT_AMBULATORY_CARE_PROVIDER_SITE_OTHER): Payer: BLUE CROSS/BLUE SHIELD | Admitting: Family Medicine

## 2018-07-18 VITALS — BP 118/82 | HR 67 | Temp 98.2°F | Ht 70.25 in | Wt 260.2 lb

## 2018-07-18 DIAGNOSIS — Z0000 Encounter for general adult medical examination without abnormal findings: Secondary | ICD-10-CM

## 2018-07-18 DIAGNOSIS — Z6835 Body mass index (BMI) 35.0-35.9, adult: Secondary | ICD-10-CM

## 2018-07-18 DIAGNOSIS — E785 Hyperlipidemia, unspecified: Secondary | ICD-10-CM

## 2018-07-18 DIAGNOSIS — R7303 Prediabetes: Secondary | ICD-10-CM

## 2018-07-18 DIAGNOSIS — E039 Hypothyroidism, unspecified: Secondary | ICD-10-CM

## 2018-07-18 DIAGNOSIS — K7581 Nonalcoholic steatohepatitis (NASH): Secondary | ICD-10-CM

## 2018-07-18 LAB — POCT GLYCOSYLATED HEMOGLOBIN (HGB A1C): Hemoglobin A1C: 5.2 % (ref 4.0–5.6)

## 2018-07-18 MED ORDER — LEVOTHYROXINE SODIUM 137 MCG PO TABS: 137.0000 ug | ORAL_TABLET | Freq: Every day | ORAL | 3 refills | Status: DC

## 2018-07-18 NOTE — Assessment & Plan Note (Signed)
Chronic, stable, asxs. Continue current regimen refilled today.

## 2018-07-18 NOTE — Assessment & Plan Note (Signed)
Mild - predominantly low HDL. Encouraged continued aerobic exercise.

## 2018-07-18 NOTE — Assessment & Plan Note (Signed)
Recent LFTs stable. Continue to monitor.

## 2018-07-18 NOTE — Assessment & Plan Note (Signed)
Congratulated on noted weight loss. Discussed healthy diet and lifestyle changes encouraged continued efforts at sustainable weight loss.

## 2018-07-18 NOTE — Assessment & Plan Note (Addendum)
Update A1c today. Anticipate improved control based on weight loss noted.

## 2018-07-18 NOTE — Patient Instructions (Addendum)
Consider egg free flu shot available (flublok) Fingerstick A1c today You are doing well today - continue regular exercise regimen. Return as needed or in 1 year for next physical  Health Maintenance, Male A healthy lifestyle and preventive care is important for your health and wellness. Ask your health care provider about what schedule of regular examinations is right for you. What should I know about weight and diet? Eat a Healthy Diet  Eat plenty of vegetables, fruits, whole grains, low-fat dairy products, and lean protein.  Do not eat a lot of foods high in solid fats, added sugars, or salt.  Maintain a Healthy Weight Regular exercise can help you achieve or maintain a healthy weight. You should:  Do at least 150 minutes of exercise each week. The exercise should increase your heart rate and make you sweat (moderate-intensity exercise).  Do strength-training exercises at least twice a week.  Watch Your Levels of Cholesterol and Blood Lipids  Have your blood tested for lipids and cholesterol every 5 years starting at 39 years of age. If you are at high risk for heart disease, you should start having your blood tested when you are 39 years old. You may need to have your cholesterol levels checked more often if: ? Your lipid or cholesterol levels are high. ? You are older than 38 years of age. ? You are at high risk for heart disease.  What should I know about cancer screening? Many types of cancers can be detected early and may often be prevented. Lung Cancer  You should be screened every year for lung cancer if: ? You are a current smoker who has smoked for at least 30 years. ? You are a former smoker who has quit within the past 15 years.  Talk to your health care provider about your screening options, when you should start screening, and how often you should be screened.  Colorectal Cancer  Routine colorectal cancer screening usually begins at 39 years of age and should be  repeated every 5-10 years until you are 39 years old. You may need to be screened more often if early forms of precancerous polyps or small growths are found. Your health care provider may recommend screening at an earlier age if you have risk factors for colon cancer.  Your health care provider may recommend using home test kits to check for hidden blood in the stool.  A small camera at the end of a tube can be used to examine your colon (sigmoidoscopy or colonoscopy). This checks for the earliest forms of colorectal cancer.  Prostate and Testicular Cancer  Depending on your age and overall health, your health care provider may do certain tests to screen for prostate and testicular cancer.  Talk to your health care provider about any symptoms or concerns you have about testicular or prostate cancer.  Skin Cancer  Check your skin from head to toe regularly.  Tell your health care provider about any new moles or changes in moles, especially if: ? There is a change in a mole's size, shape, or color. ? You have a mole that is larger than a pencil eraser.  Always use sunscreen. Apply sunscreen liberally and repeat throughout the day.  Protect yourself by wearing long sleeves, pants, a wide-brimmed hat, and sunglasses when outside.  What should I know about heart disease, diabetes, and high blood pressure?  If you are 95-54 years of age, have your blood pressure checked every 3-5 years. If you are 40  40 years of age or older, have your blood pressure checked every year. You should have your blood pressure measured twice-once when you are at a hospital or clinic, and once when you are not at a hospital or clinic. Record the average of the two measurements. To check your blood pressure when you are not at a hospital or clinic, you can use: ? An automated blood pressure machine at a pharmacy. ? A home blood pressure monitor.  Talk to your health care provider about your target blood  pressure.  If you are between 45-79 years old, ask your health care provider if you should take aspirin to prevent heart disease.  Have regular diabetes screenings by checking your fasting blood sugar level. ? If you are at a normal weight and have a low risk for diabetes, have this test once every three years after the age of 45. ? If you are overweight and have a high risk for diabetes, consider being tested at a younger age or more often.  A one-time screening for abdominal aortic aneurysm (AAA) by ultrasound is recommended for men aged 65-75 years who are current or former smokers. What should I know about preventing infection? Hepatitis B If you have a higher risk for hepatitis B, you should be screened for this virus. Talk with your health care provider to find out if you are at risk for hepatitis B infection. Hepatitis C Blood testing is recommended for:  Everyone born from 1945 through 1965.  Anyone with known risk factors for hepatitis C.  Sexually Transmitted Diseases (STDs)  You should be screened each year for STDs including gonorrhea and chlamydia if: ? You are sexually active and are younger than 39 years of age. ? You are older than 39 years of age and your health care provider tells you that you are at risk for this type of infection. ? Your sexual activity has changed since you were last screened and you are at an increased risk for chlamydia or gonorrhea. Ask your health care provider if you are at risk.  Talk with your health care provider about whether you are at high risk of being infected with HIV. Your health care provider may recommend a prescription medicine to help prevent HIV infection.  What else can I do?  Schedule regular health, dental, and eye exams.  Stay current with your vaccines (immunizations).  Do not use any tobacco products, such as cigarettes, chewing tobacco, and e-cigarettes. If you need help quitting, ask your health care  provider.  Limit alcohol intake to no more than 2 drinks per day. One drink equals 12 ounces of beer, 5 ounces of wine, or 1 ounces of hard liquor.  Do not use street drugs.  Do not share needles.  Ask your health care provider for help if you need support or information about quitting drugs.  Tell your health care provider if you often feel depressed.  Tell your health care provider if you have ever been abused or do not feel safe at home. This information is not intended to replace advice given to you by your health care provider. Make sure you discuss any questions you have with your health care provider. Document Released: 03/18/2008 Document Revised: 05/19/2016 Document Reviewed: 06/24/2015 Elsevier Interactive Patient Education  2018 [employer] Inc[employer]  

## 2018-11-07 ENCOUNTER — Telehealth: Payer: Self-pay

## 2018-11-08 NOTE — Telephone Encounter (Signed)
Noted. Thanks.

## 2018-11-08 NOTE — Telephone Encounter (Signed)
Patient Name: Antonio Doyle Gender: Male DOB: 01-15-1981 Age: 38 Y 10 D Return Phone Number: [phone] (Primary) Address: City/State/Zip: [place] [address] Client [facility] Night - Client Client Site [place] Physician Ria Bush - MD Contact Type Call Who Is Calling Patient / Member / Family / Caregiver Call Type Triage / Clinical Relationship To Patient Self Return Phone Number [phone] (Primary) Chief Complaint Skin Lesion - Moles/ Lumps/ Growths Reason for Call Symptomatic / Request for [place] states he had a cyst come up on his neck and he wants to know if that can be treated in the office or if he needs to see a specialist. Translation No Nurse Assessment Nurse: Sumner Boast, RN, [place] Derry Date/Time ([place] Time): 11/07/2018 9:44:55 PM Confirm and document reason for call. If symptomatic, describe symptoms. ---Caller states he has a cyst on his neck right behind his Rt ear and is about the size of a quarter. It has been there a few weeks. It started out very small and has grown. It is painful all the time. Pain is 2-3 on pain scale of 10. He wants to know if that can be treated in the office or if he needs to see a specialist. No fever. Does the patient have any new or worsening symptoms? ---Yes Will a triage be completed? ---Yes Related visit to physician within the last 2 weeks? ---No Does the PT have any chronic conditions? (i.e. diabetes, asthma, this includes High risk factors for pregnancy, etc.) ---Yes List chronic conditions. ---Hypothyroidism Is this a behavioral health or substance abuse call? ---No Guidelines Guideline Title Affirmed Question Affirmed Notes Nurse Date/Time (Eastern Time) Skin Lesion - Moles or Growths [1] Skin growth or mole AND[2] larger than a pencil eraser, or increasing in size [place], RN, [place] Derry 11/07/2018 9:50:11 PM Disp. Time  [place] Ghazi Time) Disposition Final User 11/07/2018 9:53:34 PM See PCP within 2 Weeks Yes Sumner Boast, RN, [place] Derry PLEASE NOTE: All timestamps contained within this report are represented as [place] Standard Time. CONFIDENTIALTY NOTICE: This fax transmission is intended only for the addressee. It contains information that is legally privileged, confidential or otherwise protected from use or disclosure. If you are not the intended recipient, you are strictly prohibited from reviewing, disclosing, copying using or disseminating any of this information or taking any action in reliance on or regarding this information. If you have received this fax in error, please notify us immediately by telephone so that we can arrange for its return to [place]. Phone: [phone], Toll-Free: [phone], Fax: [phone] Page: 2 of 2 Call Id: [id number] [place] Disagree/Comply Comply Caller Understands No PreDisposition Call Doctor Care Advice Given Per Guideline SEE PCP WITHIN 2 WEEKS: * You need to be seen for this ongoing problem within the next 2 weeks. Call your doctor (or NP/ PA) during regular office hours and make an appointment. CALL BACK IF: * Fever occurs * It looks infected * You become worse. CARE ADVICE given per Skin Lesions - Moles, Lumps and Growths (Adult) guideline. Referrals REFERRED TO PCP OFFICE REFERRED TO PCP OFFICE

## 2018-11-08 NOTE — Telephone Encounter (Signed)
Noted  

## 2018-11-08 NOTE — Telephone Encounter (Signed)
Discussed symptoms with patient.  Offered multiple appts this week and next.  Patient could only come at 815 on Tuesday, 2/11 to see Dr. Para March as Dr. Reece Agar will be out of the office.  Patient has painful, ?infected cyst/boil behind R ear.  He is going to apply warm compresses and will call us in the meantime if symptoms worsen.  FYI to Dr. Para March and Dr. Reece Agar.

## 2018-11-14 ENCOUNTER — Encounter: Payer: Self-pay | Admitting: Family Medicine

## 2018-11-14 ENCOUNTER — Ambulatory Visit (INDEPENDENT_AMBULATORY_CARE_PROVIDER_SITE_OTHER): Payer: BLUE CROSS/BLUE SHIELD | Admitting: Family Medicine

## 2018-11-14 DIAGNOSIS — L723 Sebaceous cyst: Secondary | ICD-10-CM

## 2018-11-14 NOTE — Progress Notes (Signed)
Lesion behind R hear, present for a few weeks.  Not ttp.  Gradually bigger. No FCNAV.  No ear pain.  No drainage.  H/o mult seb cysts.    Meds, vitals, and allergies reviewed.   ROS: Per HPI unless specifically indicated in ROS section   nad Bilateral pinna within normal limits. It looks like he has a small sebaceous cyst posterior to the right pinna.  Not ttp.  ~1cm in diameter.  Not draining.  Not tender. Neck supple.  He has a separate sebaceous cyst on the left posterior side of his neck.

## 2018-11-14 NOTE — Patient Instructions (Signed)
No charge for visit.   Keep using warm compresses.   I wouldn't try to drain this unless it became tender.  It is likely better for it to drain on its own.   Take care.  Glad to see you.

## 2018-11-15 DIAGNOSIS — L723 Sebaceous cyst: Secondary | ICD-10-CM | POA: Insufficient documentation

## 2018-11-15 DIAGNOSIS — L089 Local infection of the skin and subcutaneous tissue, unspecified: Secondary | ICD-10-CM | POA: Insufficient documentation

## 2018-11-15 NOTE — Assessment & Plan Note (Signed)
Small, not tender, does not appear infected or inflamed. No charge for visit.   Keep using warm compresses.   I wouldn't try to drain this unless it became tender.  It is likely better for it to drain on its own.   The area is small enough that it would be difficult to pack if we performed incision and drainage at this point.  Discussed with patient.  He agrees.

## 2019-05-26 ENCOUNTER — Ambulatory Visit (INDEPENDENT_AMBULATORY_CARE_PROVIDER_SITE_OTHER)
Admission: RE | Admit: 2019-05-26 | Discharge: 2019-05-26 | Disposition: A | Discharge status: Home / Self Care | Payer: BC Managed Care – PPO | Source: Ambulatory Visit

## 2019-05-26 DIAGNOSIS — L729 Follicular cyst of the skin and subcutaneous tissue, unspecified: Secondary | ICD-10-CM

## 2019-05-26 DIAGNOSIS — L089 Local infection of the skin and subcutaneous tissue, unspecified: Secondary | ICD-10-CM

## 2019-05-26 MED ORDER — CEPHALEXIN 500 MG PO CAPS: 500.0000 mg | ORAL_CAPSULE | Freq: Four times a day (QID) | ORAL | 0 refills | Status: DC

## 2019-05-26 NOTE — Discharge Instructions (Signed)
Start keflex as directed. Warm compress 1-3 times a day for 15-96mins at a time. As discussed, if symptoms does not resolve, will need to be seen in person for drainage. If noticing spreading redness after being on keflex for >24 hours, will need reevaluation as well.

## 2019-05-26 NOTE — ED Provider Notes (Signed)
Virtual Visit via Video Note:  Antonio Doyle  initiated request for Telemedicine visit with [facility]gent [facility] team. I connected with Antonio Doyle  on 05/26/2019 at 12:26 PM  for a synchronized telemedicine visit using a video enabled HIPPA compliant telemedicine application. I verified that I am speaking with Antonio Doyle  using two identifiers. Antonio Amato Jodell Cipro, PA-C  was physically located in a [facility] Urgent care site and Antonio Doyle was located at a different location.   The limitations of evaluation and management by telemedicine as well as the availability of in-person appointments were discussed. Patient was informed that he  may incur a bill ( including co-pay) for this virtual visit encounter. Antonio Doyle  expressed understanding and gave verbal consent to proceed with virtual visit.     History of Present Illness:Antonio Doyle  is a 38 y.o. male presents with few day history of redness, swelling, pain to known cyst.  Patient states has had a cyst still left side of neck for 20+ years.  For the past few days, has noticed mild increase in size with redness and pain.  This is making his neck stiff, but is able to move neck without difficulty.  Denies radiation of pain.  Denies fever, chills, body aches.  He has not taken anything for the symptoms.    Past Medical History:  Diagnosis Date  . Dyslipidemia   . GERD (gastroesophageal reflux disease)   . Hypothyroidism   . NAFLD (nonalcoholic fatty liver disease) 2014   likely NASH - diffuse with hepatomegaly and borderline splenomegaly s/p GI w/u (MRI 2014, [facility])  . Obesity     Allergies  Allergen Reactions  . Eggs Or Egg-Derived Products Anaphylaxis        Observations/Objective: General: Well appearing, nontoxic, no acute distress. Sitting comfortably. Head: Normocephalic, atraumatic Neck: 5 cm x 5 cm round cyst seen on the left neck.  Area is erythematous.  Patient states is warm to touch, and cyst is  hard to palpation.  Full range of motion of neck. Eye: No conjunctival injection, eyelid swelling. EOMI ENT: Mucus membranes moist, no lip cracking. No obvious nasal drainage. Pulm: Speaking in full sentences without difficulty. Normal effort. No respiratory distress, accessory muscle use. Neuro: Normal mental status. Alert and oriented x 3.  Assessment and Plan: Discussed treatment options including an office evaluation with I&D versus antibiotics.  Patient would like to start antibiotics at this time.  Discussed may still need in person I&D if symptoms does not resolve after antibiotic use.  Patient expresses understanding.  Start Keflex as directed, warm compress.  Return precautions given.  Patient expresses understanding and agrees to plan.  Follow Up Instructions:    I discussed the assessment and treatment plan with the patient. The patient was provided an opportunity to ask questions and all were answered. The patient agreed with the plan and demonstrated an understanding of the instructions.   The patient was advised to call back or seek an in-person evaluation if the symptoms worsen or if the condition fails to improve as anticipated.  I provided 15 minutes of non-face-to-face time during this encounter.    Ok Edwards, PA-C  05/26/2019 12:26 PM         Ok Edwards, PA-C 05/26/19 1237

## 2019-05-29 ENCOUNTER — Ambulatory Visit (INDEPENDENT_AMBULATORY_CARE_PROVIDER_SITE_OTHER): Payer: BC Managed Care – PPO | Admitting: Family Medicine

## 2019-05-29 ENCOUNTER — Encounter: Payer: Self-pay | Admitting: Family Medicine

## 2019-05-29 ENCOUNTER — Other Ambulatory Visit: Payer: Self-pay

## 2019-05-29 VITALS — BP 118/70 | HR 74 | Temp 98.3°F | Ht 70.25 in | Wt 250.2 lb

## 2019-05-29 DIAGNOSIS — L723 Sebaceous cyst: Secondary | ICD-10-CM | POA: Diagnosis not present

## 2019-05-29 DIAGNOSIS — L089 Local infection of the skin and subcutaneous tissue, unspecified: Secondary | ICD-10-CM | POA: Diagnosis not present

## 2019-05-29 NOTE — Patient Instructions (Signed)
I would like to refer you to general surgery for possible incision and drainage procedure to neck cyst.

## 2019-05-29 NOTE — Progress Notes (Signed)
This visit was conducted in person.  BP 118/70 (BP Location: Left Arm, Patient Position: Sitting, Cuff Size: Large)   Pulse 74   Temp 98.3 F (36.8 C) (Temporal)   Ht 5' 10.25" (1.784 m)   Wt 250 lb 3 oz (113.5 kg)   SpO2 98%   BMI 35.64 kg/m    CC: check L neck cyst Subjective:    Patient ID: Antonio Doyle, male    DOB: 1981-08-09, 38 y.o.   MRN: [id number]  HPI: Antonio Doyle is a 38 y.o. male presenting on 05/29/2019 for Cyst (C/o cyst on left side of neck.  Area is red and very painful. Noticed about 1 wk ago. This is 2nd cyst in about 2 mos (different location).  Had e-visit 05/26/19, prescribed abx, not helpful.  )   Noticed cyst to L neck last week, progressively worsening over last few days. Had E visit, prescribed keflex abx course QID without benefit. Also has been using hot compresses. Has also been using ibuprofen 400mg  TID.       Relevant past medical, surgical, family and social history reviewed and updated as indicated. Interim medical history since our last visit reviewed. Allergies and medications reviewed and updated. Outpatient Medications Prior to Visit  Medication Sig Dispense Refill  . cephALEXin (KEFLEX) 500 MG capsule Take 1 capsule (500 mg total) by mouth 4 (four) times daily. 28 capsule 0  . levothyroxine (SYNTHROID, LEVOTHROID) 137 MCG tablet Take 1 tablet (137 mcg total) by mouth daily. 90 tablet 3   No facility-administered medications prior to visit.      Per HPI unless specifically indicated in ROS section below Review of Systems Objective:    BP 118/70 (BP Location: Left Arm, Patient Position: Sitting, Cuff Size: Large)   Pulse 74   Temp 98.3 F (36.8 C) (Temporal)   Ht 5' 10.25" (1.784 m)   Wt 250 lb 3 oz (113.5 kg)   SpO2 98%   BMI 35.64 kg/m   Wt Readings from Last 3 Encounters:  05/29/19 250 lb 3 oz (113.5 kg)  07/18/18 260 lb 4 oz (118 kg)  06/02/17 272 lb 8 oz (123.6 kg)    Physical Exam Vitals signs and nursing note  reviewed.  Constitutional:      General: He is not in acute distress.    Appearance: Normal appearance. He is not ill-appearing.  Neck:     Musculoskeletal: Normal range of motion. Erythema present.      Comments: Large erythematous fluctuant tender mass ~6x6cm L posterior neck Lymphadenopathy:     Cervical: No cervical adenopathy.  Neurological:     Mental Status: He is alert.       Results for orders placed or performed in visit on 07/18/18  POCT glycosylated hemoglobin (Hb A1C)  Result Value Ref Range   Hemoglobin A1C 5.2 4.0 - 5.6 %   HbA1c POC (<> result, manual entry)     HbA1c, POC (prediabetic range)     HbA1c, POC (controlled diabetic range)     Lab Results  Component Value Date   TSH 0.94 01/06/2018    Assessment & Plan:   Problem List Items Addressed This Visit    Infected sebaceous cyst - Primary    Large, not responding to keflex abx and warm compresses. Likely needs I&D. No systemic symptoms. Large enough that I'd prefer surgical evaluation - will see if we can get him in urgently at central [facility] surgery. Pt agrees with plan.  Relevant Orders   Ambulatory referral to General Surgery       No orders of the defined types were placed in this encounter.  Orders Placed This Encounter  Procedures  . Ambulatory referral to General Surgery    Referral Priority:   Urgent    Referral Type:   Surgical    Referral Reason:   Specialty Services Required    Requested Specialty:   General Surgery    Number of Visits Requested:   1    Patient Instructions  I would like to refer you to general surgery for possible incision and drainage procedure to neck cyst.    Follow up plan: Return if symptoms worsen or fail to improve.  Eustaquio BoydenJavier Ayako Tapanes, MD

## 2019-05-29 NOTE — Assessment & Plan Note (Addendum)
Large, not responding to keflex abx and warm compresses. Likely needs I&D. No systemic symptoms. Large enough that I'd prefer surgical evaluation - will see if we can get him in urgently at central [facility] surgery. Pt agrees with plan.

## 2019-07-12 ENCOUNTER — Other Ambulatory Visit: Payer: Self-pay | Admitting: Family Medicine

## 2019-07-12 NOTE — Telephone Encounter (Signed)
Last office visit 05/29/2019 for infected sebaceous cyst.  Last refilled 07/18/2018 for #90 with 3 refills.  Last thyroid labs 01/06/2018.  No future appointments.  Refill?

## 2019-07-12 NOTE — Telephone Encounter (Signed)
Sent in. Will need to schedule CPE

## 2019-07-13 NOTE — Telephone Encounter (Signed)
Please schedule CPE for Dr. Darnell Level.

## 2019-07-16 NOTE — Telephone Encounter (Signed)
Pt is scheduled labs 12/8 and CPE 12/15

## 2019-09-07 ENCOUNTER — Telehealth: Payer: Self-pay

## 2019-09-07 NOTE — Telephone Encounter (Signed)
LVM to call clinic, needs COVID screen, front door and back lab info 12.4.2020 TLJ

## 2019-09-10 ENCOUNTER — Other Ambulatory Visit: Payer: Self-pay | Admitting: Family Medicine

## 2019-09-10 DIAGNOSIS — E785 Hyperlipidemia, unspecified: Secondary | ICD-10-CM

## 2019-09-10 DIAGNOSIS — E039 Hypothyroidism, unspecified: Secondary | ICD-10-CM

## 2019-09-10 DIAGNOSIS — K7581 Nonalcoholic steatohepatitis (NASH): Secondary | ICD-10-CM

## 2019-09-10 DIAGNOSIS — R7303 Prediabetes: Secondary | ICD-10-CM

## 2019-09-11 ENCOUNTER — Other Ambulatory Visit (INDEPENDENT_AMBULATORY_CARE_PROVIDER_SITE_OTHER): Payer: BC Managed Care – PPO

## 2019-09-11 ENCOUNTER — Other Ambulatory Visit: Payer: Self-pay

## 2019-09-11 DIAGNOSIS — K7581 Nonalcoholic steatohepatitis (NASH): Secondary | ICD-10-CM | POA: Diagnosis not present

## 2019-09-11 DIAGNOSIS — R7303 Prediabetes: Secondary | ICD-10-CM

## 2019-09-11 DIAGNOSIS — E785 Hyperlipidemia, unspecified: Secondary | ICD-10-CM

## 2019-09-11 DIAGNOSIS — E039 Hypothyroidism, unspecified: Secondary | ICD-10-CM | POA: Diagnosis not present

## 2019-09-11 LAB — COMPREHENSIVE METABOLIC PANEL
ALT: 24 U/L (ref 0–53)
AST: 16 U/L (ref 0–37)
Albumin: 4.4 g/dL (ref 3.5–5.2)
Alkaline Phosphatase: 80 U/L (ref 39–117)
BUN: 14 mg/dL (ref 6–23)
CO2: 29 mEq/L (ref 19–32)
Calcium: 9.3 mg/dL (ref 8.4–10.5)
Chloride: 106 mEq/L (ref 96–112)
Creatinine, Ser: 1.11 mg/dL (ref 0.40–1.50)
GFR: 73.05 mL/min (ref >60.00)
Glucose, Bld: 96 mg/dL (ref 70–99)
Potassium: 4 mEq/L (ref 3.5–5.1)
Sodium: 140 mEq/L (ref 135–145)
Total Bilirubin: 0.8 mg/dL (ref 0.2–1.2)
Total Protein: 6.8 g/dL (ref 6.0–8.3)

## 2019-09-11 LAB — CBC WITH DIFFERENTIAL/PLATELET
Basophils Absolute: 0.1 10*3/uL (ref 0.0–0.1)
Basophils Relative: 0.9 % (ref 0.0–3.0)
Eosinophils Absolute: 0.2 10*3/uL (ref 0.0–0.7)
Eosinophils Relative: 2.9 % (ref 0.0–5.0)
HCT: 45.4 % (ref 39.0–52.0)
Hemoglobin: 15 g/dL (ref 13.0–17.0)
Lymphocytes Relative: 26 % (ref 12.0–46.0)
Lymphs Abs: 1.7 10*3/uL (ref 0.7–4.0)
MCHC: 33.2 g/dL (ref 30.0–36.0)
MCV: 88.4 fl (ref 78.0–100.0)
Monocytes Absolute: 0.5 10*3/uL (ref 0.1–1.0)
Monocytes Relative: 8.1 % (ref 3.0–12.0)
Neutro Abs: 4.1 10*3/uL (ref 1.4–7.7)
Neutrophils Relative %: 62.1 % (ref 43.0–77.0)
Platelets: 224 10*3/uL (ref 150.0–400.0)
RBC: 5.13 Mil/uL (ref 4.22–5.81)
RDW: 13.1 % (ref 11.5–15.5)
WBC: 6.7 10*3/uL (ref 4.0–10.5)

## 2019-09-11 LAB — LIPID PANEL
Cholesterol: 115 mg/dL (ref 0–200)
HDL: 32.3 mg/dL — ABNORMAL LOW (ref >39.00)
LDL Cholesterol: 50 mg/dL (ref 0–99)
NonHDL: 83.07
Total CHOL/HDL Ratio: 4
Triglycerides: 164 mg/dL — ABNORMAL HIGH (ref 0.0–149.0)
VLDL: 32.8 mg/dL (ref 0.0–40.0)

## 2019-09-11 LAB — TSH: TSH: 0.29 u[IU]/mL — ABNORMAL LOW (ref 0.35–4.50)

## 2019-09-11 LAB — HEMOGLOBIN A1C: Hgb A1c MFr Bld: 5.2 % (ref 4.6–6.5)

## 2019-09-12 ENCOUNTER — Other Ambulatory Visit (INDEPENDENT_AMBULATORY_CARE_PROVIDER_SITE_OTHER): Payer: BC Managed Care – PPO

## 2019-09-12 DIAGNOSIS — E039 Hypothyroidism, unspecified: Secondary | ICD-10-CM

## 2019-09-12 LAB — T4, FREE: Free T4: 1.04 ng/dL (ref 0.60–1.60)

## 2019-09-12 NOTE — Addendum Note (Signed)
Addended by: Ellamae Sia on: 09/12/2019 07:44 AM   Modules accepted: Orders

## 2019-09-13 LAB — T3: T3, Total: 114 ng/dL (ref 76–181)

## 2019-09-18 ENCOUNTER — Ambulatory Visit (INDEPENDENT_AMBULATORY_CARE_PROVIDER_SITE_OTHER): Payer: BC Managed Care – PPO | Admitting: Family Medicine

## 2019-09-18 ENCOUNTER — Other Ambulatory Visit: Payer: Self-pay

## 2019-09-18 ENCOUNTER — Encounter: Payer: Self-pay | Admitting: Family Medicine

## 2019-09-18 VITALS — BP 126/70 | HR 90 | Temp 98.6°F | Ht 70.0 in | Wt 249.6 lb

## 2019-09-18 DIAGNOSIS — E039 Hypothyroidism, unspecified: Secondary | ICD-10-CM | POA: Diagnosis not present

## 2019-09-18 DIAGNOSIS — Z0000 Encounter for general adult medical examination without abnormal findings: Secondary | ICD-10-CM

## 2019-09-18 DIAGNOSIS — Z23 Encounter for immunization: Secondary | ICD-10-CM

## 2019-09-18 DIAGNOSIS — L723 Sebaceous cyst: Secondary | ICD-10-CM

## 2019-09-18 DIAGNOSIS — R7303 Prediabetes: Secondary | ICD-10-CM

## 2019-09-18 DIAGNOSIS — K7581 Nonalcoholic steatohepatitis (NASH): Secondary | ICD-10-CM

## 2019-09-18 DIAGNOSIS — E785 Hyperlipidemia, unspecified: Secondary | ICD-10-CM | POA: Diagnosis not present

## 2019-09-18 DIAGNOSIS — Z6835 Body mass index (BMI) 35.0-35.9, adult: Secondary | ICD-10-CM

## 2019-09-18 MED ORDER — LEVOTHYROXINE SODIUM 137 MCG PO TABS: 137.0000 ug | ORAL_TABLET | Freq: Every day | ORAL | 3 refills | Status: DC

## 2019-09-18 NOTE — Assessment & Plan Note (Addendum)
Chronic, mild off meds The ASCVD Risk score Mikey Bussing DC Jr., et al., 2013) failed to calculate for the following reasons:   The valid total cholesterol range is 130 to 320 mg/dL

## 2019-09-18 NOTE — Progress Notes (Signed)
This visit was conducted in person.  BP 126/70 (BP Location: Left Arm, Patient Position: Sitting, Cuff Size: Large)   Pulse 90   Temp 98.6 F (37 C) (Temporal)   Ht 5' 10"  (1.778 m)   Wt 249 lb 9 oz (113.2 kg)   SpO2 98%   BMI 35.81 kg/m    CC: CPE Subjective:    Patient ID: Antonio Doyle, male    DOB: 1980/12/30, 38 y.o.   MRN: [id number]  HPI: Antonio Doyle is a 38 y.o. male presenting on 09/18/2019 for Annual Exam   Mother currently hospitalized with Covid at [facility].   Infected sebaceous cyst to neck s/p ER eval that did not respond to keflex - referred to gen surgery s/p in office I&D. Declined OR surgery to fully remove.   Hypothyroidism, NASH, prediabetes. Denies hypothyroid symptoms.   Preventative: Flu allergic to eggs - declines flu blok  Td ~2010. Tdap today Seat belt use discussed.  Sunscreen use discussed. No changing moles on skin. Non smoker  Alcohol - none Dentist yearly Eye exam - not recently   Caffeine: none Lives with wife and 2 daughters, 2 dogs  Occupation:[employer] in [place] Edu: college  Activity:working out at [employer] 4d/wk or at park Diet: good water,fruits/vegetables daily     Relevant past medical, surgical, family and social history reviewed and updated as indicated. Interim medical history since our last visit reviewed. Allergies and medications reviewed and updated. Outpatient Medications Prior to Visit  Medication Sig Dispense Refill  . levothyroxine (SYNTHROID) 137 MCG tablet TAKE 1 TABLET BY MOUTH EVERY DAY 90 tablet 0  . cephALEXin (KEFLEX) 500 MG capsule Take 1 capsule (500 mg total) by mouth 4 (four) times daily. 28 capsule 0   No facility-administered medications prior to visit.     Per HPI unless specifically indicated in ROS section below Review of Systems  Constitutional: Negative for activity change, appetite change, chills, fatigue, fever and unexpected weight change.  HENT: Negative for hearing loss.     Eyes: Negative for visual disturbance.  Respiratory: Negative for cough, chest tightness, shortness of breath and wheezing.   Cardiovascular: Negative for chest pain, palpitations and leg swelling.  Gastrointestinal: Negative for abdominal distention, abdominal pain, blood in stool, constipation, diarrhea, nausea and vomiting.  Genitourinary: Negative for difficulty urinating and hematuria.  Musculoskeletal: Negative for arthralgias, myalgias and neck pain.  Skin: Negative for rash.  Neurological: Negative for dizziness, seizures, syncope and headaches.  Hematological: Negative for adenopathy. Does not bruise/bleed easily.  Psychiatric/Behavioral: Negative for dysphoric mood. The patient is not nervous/anxious.    Objective:    BP 126/70 (BP Location: Left Arm, Patient Position: Sitting, Cuff Size: Large)   Pulse 90   Temp 98.6 F (37 C) (Temporal)   Ht 5' 10"  (1.778 m)   Wt 249 lb 9 oz (113.2 kg)   SpO2 98%   BMI 35.81 kg/m   Wt Readings from Last 3 Encounters:  09/18/19 249 lb 9 oz (113.2 kg)  05/29/19 250 lb 3 oz (113.5 kg)  07/18/18 260 lb 4 oz (118 kg)    Physical Exam Vitals and nursing note reviewed.  Constitutional:      General: He is not in acute distress.    Appearance: Normal appearance. He is well-developed. He is obese. He is not ill-appearing.  HENT:     Head: Normocephalic and atraumatic.     Right Ear: Hearing, tympanic membrane, ear canal and external ear normal.  Left Ear: Hearing, tympanic membrane, ear canal and external ear normal.     Nose: Nose normal.     Mouth/Throat:     Pharynx: Uvula midline.  Eyes:     General: No scleral icterus.    Extraocular Movements: Extraocular movements intact.     Conjunctiva/sclera: Conjunctivae normal.     Pupils: Pupils are equal, round, and reactive to light.  Neck:     Thyroid: No thyromegaly or thyroid tenderness.  Cardiovascular:     Rate and Rhythm: Normal rate and regular rhythm.     Pulses: Normal  pulses.          Radial pulses are 2+ on the right side and 2+ on the left side.     Heart sounds: Normal heart sounds. No murmur.  Pulmonary:     Effort: Pulmonary effort is normal. No respiratory distress.     Breath sounds: Normal breath sounds. No wheezing, rhonchi or rales.  Abdominal:     General: Abdomen is flat. Bowel sounds are normal. There is no distension.     Palpations: Abdomen is soft. There is no mass.     Tenderness: There is no abdominal tenderness. There is no guarding or rebound.     Hernia: No hernia is present.  Musculoskeletal:        General: Normal range of motion.     Cervical back: Normal range of motion and neck supple.     Right lower leg: No edema.     Left lower leg: No edema.  Lymphadenopathy:     Cervical: No cervical adenopathy.  Skin:    General: Skin is warm and dry.     Findings: Lesion present. No rash.     Comments: Non inflamed cysts on back  Neurological:     General: No focal deficit present.     Mental Status: He is alert and oriented to person, place, and time.     Comments: CN grossly intact, station and gait intact  Psychiatric:        Mood and Affect: Mood normal.        Behavior: Behavior normal.        Thought Content: Thought content normal.        Judgment: Judgment normal.       Results for orders placed or performed in visit on 09/12/19  T4, free  Result Value Ref Range   Free T4 1.04 0.60 - 1.60 ng/dL   Assessment & Plan:  This visit occurred during the SARS-CoV-2 public health emergency.  Safety protocols were in place, including screening questions prior to the visit, additional usage of staff PPE, and extensive cleaning of exam room while observing appropriate contact time as indicated for disinfecting solutions.   Problem List Items Addressed This Visit    Severe obesity (BMI 35.0-35.9 with comorbidity) ([place])    Encouraged ongoing healthy diet and lifestyle changes to affect sustainable weight loss.        Sebaceous cyst    S/p I&D with drain by gen surgery of infected neck cyst earlier this year. He declined definitive removal afterwards. Overall doing well at this time. He does have several other non inflamed sebaceous cysts of back.       Prediabetes    A1c remains normal. Anticipate 10 lb weight loss has helped.      NASH (nonalcoholic steatohepatitis)    LFTs normal. Encouraged ongoing weight loss efforts.       Hypothyroidism  Chronic, stable. TSH low but T3, fT4 normal. Patient without hypothyroid symptoms at this time. Continue current regimen.       Relevant Medications   levothyroxine (SYNTHROID) 137 MCG tablet   Healthcare maintenance    Preventative protocols reviewed and updated unless pt declined. Discussed healthy diet and lifestyle.       Dyslipidemia    Chronic, mild off meds The ASCVD Risk score Mikey Bussing DC Jr., et al., 2013) failed to calculate for the following reasons:   The valid total cholesterol range is 130 to 320 mg/dL        Other Visit Diagnoses    Need for Tdap vaccination    -  Primary   Relevant Orders   Tdap vaccine greater than or equal to 7yo IM       Meds ordered this encounter  Medications  . levothyroxine (SYNTHROID) 137 MCG tablet    Sig: Take 1 tablet (137 mcg total) by mouth daily.    Dispense:  90 tablet    Refill:  3   Orders Placed This Encounter  Procedures  . Tdap vaccine greater than or equal to 7yo IM    Patient instructions: Tdap today. Return in 6 months for lab visit only (recheck thyroid) You are doing well today Return as needed or in 1 year for next physical.   Follow up plan: Return in about 1 year (around 09/17/2020) for annual exam, prior fasting for blood work.  Ria Bush, MD

## 2019-09-18 NOTE — Patient Instructions (Addendum)
Tdap today. Return in 6 months for lab visit only (recheck thyroid) You are doing well today Return as needed or in 1 year for next physical.   Health Maintenance, Male Adopting a healthy lifestyle and getting preventive care are important in promoting health and wellness. Ask your health care provider about:  The right schedule for you to have regular tests and exams.  Things you can do on your own to prevent diseases and keep yourself healthy. What should I know about diet, weight, and exercise? Eat a healthy diet   Eat a diet that includes plenty of vegetables, fruits, low-fat dairy products, and lean protein.  Do not eat a lot of foods that are high in solid fats, added sugars, or sodium. Maintain a healthy weight Body mass index (BMI) is a measurement that can be used to identify possible weight problems. It estimates body fat based on height and weight. Your health care provider can help determine your BMI and help you achieve or maintain a healthy weight. Get regular exercise Get regular exercise. This is one of the most important things you can do for your health. Most adults should:  Exercise for at least 150 minutes each week. The exercise should increase your heart rate and make you sweat (moderate-intensity exercise).  Do strengthening exercises at least twice a week. This is in addition to the moderate-intensity exercise.  Spend less time sitting. Even light physical activity can be beneficial. Watch cholesterol and blood lipids Have your blood tested for lipids and cholesterol at 40 years of age, then have this test every 5 years. You may need to have your cholesterol levels checked more often if:  Your lipid or cholesterol levels are high.  You are older than 39 years of age.  You are at high risk for heart disease. What should I know about cancer screening? Many types of cancers can be detected early and may often be prevented. Depending on your health history  and family history, you may need to have cancer screening at various ages. This may include screening for:  Colorectal cancer.  Prostate cancer.  Skin cancer.  Lung cancer. What should I know about heart disease, diabetes, and high blood pressure? Blood pressure and heart disease  High blood pressure causes heart disease and increases the risk of stroke. This is more likely to develop in people who have high blood pressure readings, are of African descent, or are overweight.  Talk with your health care provider about your target blood pressure readings.  Have your blood pressure checked: ? Every 3-5 years if you are 72-29 years of age. ? Every year if you are 52 years old or older.  If you are between the ages of 47 and 47 and are a current or former smoker, ask your health care provider if you should have a one-time screening for abdominal aortic aneurysm (AAA). Diabetes Have regular diabetes screenings. This checks your fasting blood sugar level. Have the screening done:  Once every three years after age 72 if you are at a normal weight and have a low risk for diabetes.  More often and at a younger age if you are overweight or have a high risk for diabetes. What should I know about preventing infection? Hepatitis B If you have a higher risk for hepatitis B, you should be screened for this virus. Talk with your health care provider to find out if you are at risk for hepatitis B infection. Hepatitis C Blood testing is  recommended for:  Everyone born from [birth year] through 1967.  Anyone with known risk factors for hepatitis C. Sexually transmitted infections (STIs)  You should be screened each year for STIs, including gonorrhea and chlamydia, if: ? You are sexually active and are younger than 40 years of age. ? You are older than 40 years of age and your health care provider tells you that you are at risk for this type of infection. ? Your sexual activity has changed since you were  last screened, and you are at increased risk for chlamydia or gonorrhea. Ask your health care provider if you are at risk.  Ask your health care provider about whether you are at high risk for HIV. Your health care provider may recommend a prescription medicine to help prevent HIV infection. If you choose to take medicine to prevent HIV, you should first get tested for HIV. You should then be tested every 3 months for as long as you are taking the medicine. Follow these instructions at home: Lifestyle  Do not use any products that contain nicotine or tobacco, such as cigarettes, e-cigarettes, and chewing tobacco. If you need help quitting, ask your health care provider.  Do not use street drugs.  Do not share needles.  Ask your health care provider for help if you need support or information about quitting drugs. Alcohol use  Do not drink alcohol if your health care provider tells you not to drink.  If you drink alcohol: ? Limit how much you have to 0-2 drinks a day. ? Be aware of how much alcohol is in your drink. In the [place] one drink equals one 12 oz bottle of beer (355 mL), one 5 oz glass of wine (148 mL), or one 1 oz glass of hard liquor (44 mL). General instructions  Schedule regular health, dental, and eye exams.  Stay current with your vaccines.  Tell your health care provider if: ? You often feel depressed. ? You have ever been abused or do not feel safe at home. Summary  Adopting a healthy lifestyle and getting preventive care are important in promoting health and wellness.  Follow your health care provider's instructions about healthy diet, exercising, and getting tested or screened for diseases.  Follow your health care provider's instructions on monitoring your cholesterol and blood pressure. This information is not intended to replace advice given to you by your health care provider. Make sure you discuss any questions you have with your health care  provider. Document Released: 03/18/2008 Document Revised: 09/13/2018 Document Reviewed: 09/13/2018 Elsevier Patient Education  2020 [employer].

## 2019-09-18 NOTE — Assessment & Plan Note (Signed)
Encouraged ongoing healthy diet and lifestyle changes to affect sustainable weight loss.

## 2019-09-18 NOTE — Assessment & Plan Note (Signed)
Preventative protocols reviewed and updated unless pt declined. Discussed healthy diet and lifestyle.  

## 2019-09-18 NOTE — Assessment & Plan Note (Addendum)
S/p I&D with drain by gen surgery of infected neck cyst earlier this year. He declined definitive removal afterwards. Overall doing well at this time. He does have several other non inflamed sebaceous cysts of back.

## 2019-09-18 NOTE — Assessment & Plan Note (Addendum)
LFTs normal. Encouraged ongoing weight loss efforts.

## 2019-09-18 NOTE — Assessment & Plan Note (Signed)
A1c remains normal. Anticipate 10 lb weight loss has helped.

## 2019-09-18 NOTE — Assessment & Plan Note (Addendum)
Chronic, stable. TSH low but T3, fT4 normal. Patient without hypothyroid symptoms at this time. Continue current regimen.

## 2019-10-09 ENCOUNTER — Other Ambulatory Visit: Payer: Self-pay | Admitting: Family Medicine

## 2019-10-09 NOTE — Telephone Encounter (Signed)
[facility] [address]. [facility] from the [facility].

## 2019-10-09 NOTE — Telephone Encounter (Signed)
Left message for patient to call back. Getting refill [facility] from [facility] but this was [facility] for [facility] [address] [facility]. Need to verify what pharmacy he is using.

## 2020-01-26 ENCOUNTER — Ambulatory Visit: Payer: Self-pay | Attending: Internal Medicine

## 2020-01-26 DIAGNOSIS — Z23 Encounter for immunization: Secondary | ICD-10-CM

## 2020-01-26 NOTE — Progress Notes (Signed)
   Covid-19 Vaccination Clinic  Name:  FUTURE YELDELL    MRN: [id number] DOB: Feb 14, 1981  01/26/2020  Mr. Markman was observed post Covid-19 immunization for 30 minutes based on pre-vaccination screening without incident. He was provided with Vaccine Information Sheet and instruction to access the V-Safe system.   Mr. Hardgrove was instructed to call 911 with any severe reactions post vaccine: Marland Kitchen Difficulty breathing  . Swelling of face and throat  . A fast heartbeat  . A bad rash all over body  . Dizziness and weakness   Immunizations Administered    Name Date Dose VIS Date Route   Pfizer COVID-19 Vaccine 01/26/2020  9:57 AM 0.3 mL 11/28/2018 Intramuscular   Manufacturer: [place]   Lot: [id number]   [place]: [id number]

## 2020-02-19 ENCOUNTER — Other Ambulatory Visit: Payer: Self-pay

## 2020-02-19 ENCOUNTER — Ambulatory Visit: Payer: Self-pay | Attending: Internal Medicine

## 2020-02-19 DIAGNOSIS — Z23 Encounter for immunization: Secondary | ICD-10-CM

## 2020-02-19 NOTE — Progress Notes (Signed)
   Covid-19 Vaccination [facility]  Name:  Antonio Doyle    MRN: [id number] DOB: 09/24/1981  02/19/2020  Antonio Doyle was observed post Covid-19 immunization for 15 minutes without incident. He was provided with Vaccine Information Sheet and instruction to access the V-Safe system.   Antonio Doyle was instructed to call 911 with any severe reactions post vaccine: Marland Kitchen Difficulty breathing  . Swelling of face and throat  . A fast heartbeat  . A bad rash all over body  . Dizziness and weakness   Immunizations Administered    Name Date Dose VIS Date Route   Pfizer COVID-19 Vaccine 02/19/2020  8:53 AM 0.3 mL 11/28/2018 Intramuscular   Manufacturer: [place]   Lot: [id number]   [place]: [id number]

## 2020-03-16 ENCOUNTER — Other Ambulatory Visit: Payer: Self-pay | Admitting: Family Medicine

## 2020-03-16 DIAGNOSIS — R7303 Prediabetes: Secondary | ICD-10-CM

## 2020-03-16 DIAGNOSIS — E039 Hypothyroidism, unspecified: Secondary | ICD-10-CM

## 2020-03-16 DIAGNOSIS — E785 Hyperlipidemia, unspecified: Secondary | ICD-10-CM

## 2020-03-18 ENCOUNTER — Other Ambulatory Visit: Payer: Self-pay | Admitting: Family Medicine

## 2020-03-18 ENCOUNTER — Other Ambulatory Visit (INDEPENDENT_AMBULATORY_CARE_PROVIDER_SITE_OTHER): Payer: BC Managed Care – PPO

## 2020-03-18 DIAGNOSIS — R7303 Prediabetes: Secondary | ICD-10-CM

## 2020-03-18 DIAGNOSIS — E785 Hyperlipidemia, unspecified: Secondary | ICD-10-CM

## 2020-03-18 DIAGNOSIS — E039 Hypothyroidism, unspecified: Secondary | ICD-10-CM | POA: Diagnosis not present

## 2020-03-18 LAB — LIPID PANEL
Cholesterol: 99 mg/dL (ref 0–200)
HDL: 30.5 mg/dL — ABNORMAL LOW (ref >39.00)
LDL Cholesterol: 39 mg/dL (ref 0–99)
NonHDL: 68.07
Total CHOL/HDL Ratio: 3
Triglycerides: 143 mg/dL (ref 0.0–149.0)
VLDL: 28.6 mg/dL (ref 0.0–40.0)

## 2020-03-18 LAB — COMPREHENSIVE METABOLIC PANEL
ALT: 21 U/L (ref 0–53)
AST: 16 U/L (ref 0–37)
Albumin: 4.4 g/dL (ref 3.5–5.2)
Alkaline Phosphatase: 75 U/L (ref 39–117)
BUN: 10 mg/dL (ref 6–23)
CO2: 28 mEq/L (ref 19–32)
Calcium: 9.1 mg/dL (ref 8.4–10.5)
Chloride: 107 mEq/L (ref 96–112)
Creatinine, Ser: 0.99 mg/dL (ref 0.40–1.50)
GFR: 83.15 mL/min (ref >60.00)
Glucose, Bld: 91 mg/dL (ref 70–99)
Potassium: 3.8 mEq/L (ref 3.5–5.1)
Sodium: 140 mEq/L (ref 135–145)
Total Bilirubin: 0.7 mg/dL (ref 0.2–1.2)
Total Protein: 6.7 g/dL (ref 6.0–8.3)

## 2020-03-18 LAB — HEMOGLOBIN A1C: Hgb A1c MFr Bld: 5.9 % (ref 4.6–6.5)

## 2020-03-18 LAB — TSH: TSH: 0.25 u[IU]/mL — ABNORMAL LOW (ref 0.35–4.50)

## 2020-03-18 MED ORDER — LEVOTHYROXINE SODIUM 125 MCG PO TABS: 125.0000 ug | ORAL_TABLET | Freq: Every day | ORAL | 1 refills | Status: DC

## 2020-03-19 ENCOUNTER — Other Ambulatory Visit (INDEPENDENT_AMBULATORY_CARE_PROVIDER_SITE_OTHER): Payer: BC Managed Care – PPO

## 2020-03-19 DIAGNOSIS — E039 Hypothyroidism, unspecified: Secondary | ICD-10-CM | POA: Diagnosis not present

## 2020-03-19 LAB — T4, FREE: Free T4: 0.99 ng/dL (ref 0.60–1.60)

## 2020-05-22 LAB — BASIC METABOLIC PANEL
Creatinine: 1.1 (ref 0.6–1.3)
Glucose: 99
Potassium: 4.7 (ref 3.4–5.3)

## 2020-05-22 LAB — HEPATIC FUNCTION PANEL
ALT: 22 (ref 10–40)
AST: 24 (ref 14–40)
Alkaline Phosphatase: 87 (ref 25–125)
Bilirubin, Total: 0.6

## 2020-05-22 LAB — LIPID PANEL
Cholesterol: 108 (ref 0–200)
HDL: 38 (ref 35–70)
LDL Cholesterol: 49
Triglycerides: 115 (ref 40–160)

## 2020-05-22 LAB — CBC AND DIFFERENTIAL
Hemoglobin: 15.3 (ref 13.5–17.5)
Platelets: 229 (ref 150–399)
WBC: 5.9

## 2020-05-22 LAB — TSH: TSH: 0.46 (ref 0.41–5.90)

## 2020-05-30 ENCOUNTER — Encounter: Payer: Self-pay | Admitting: Family Medicine

## 2020-05-30 NOTE — Telephone Encounter (Signed)
Printed results and placed in Dr. Synthia Innocent box.

## 2020-06-04 ENCOUNTER — Encounter: Payer: Self-pay | Admitting: Family Medicine

## 2020-09-13 ENCOUNTER — Other Ambulatory Visit: Payer: Self-pay | Admitting: Family Medicine

## 2020-09-15 NOTE — Telephone Encounter (Signed)
Last OV 09/18/19 Last refill 03/18/20 #90/1 Next OV not scheduled  >1 year since last visit, forwarding to PCP

## 2020-09-16 NOTE — Telephone Encounter (Signed)
#  90 sent in without refills.  plz schedule CPE for more refills.

## 2020-10-06 ENCOUNTER — Ambulatory Visit: Payer: BC Managed Care – PPO

## 2020-10-13 ENCOUNTER — Ambulatory Visit: Payer: BC Managed Care – PPO

## 2020-10-20 ENCOUNTER — Ambulatory Visit: Payer: BC Managed Care – PPO

## 2020-11-20 ENCOUNTER — Other Ambulatory Visit: Payer: Self-pay | Admitting: Family Medicine

## 2020-11-20 DIAGNOSIS — E039 Hypothyroidism, unspecified: Secondary | ICD-10-CM

## 2020-11-20 DIAGNOSIS — R7303 Prediabetes: Secondary | ICD-10-CM

## 2020-11-20 DIAGNOSIS — E785 Hyperlipidemia, unspecified: Secondary | ICD-10-CM

## 2020-11-21 ENCOUNTER — Other Ambulatory Visit (INDEPENDENT_AMBULATORY_CARE_PROVIDER_SITE_OTHER): Payer: BC Managed Care – PPO

## 2020-11-21 ENCOUNTER — Other Ambulatory Visit: Payer: Self-pay

## 2020-11-21 DIAGNOSIS — R7303 Prediabetes: Secondary | ICD-10-CM

## 2020-11-21 DIAGNOSIS — E039 Hypothyroidism, unspecified: Secondary | ICD-10-CM | POA: Diagnosis not present

## 2020-11-21 DIAGNOSIS — E785 Hyperlipidemia, unspecified: Secondary | ICD-10-CM | POA: Diagnosis not present

## 2020-11-21 DIAGNOSIS — R7989 Other specified abnormal findings of blood chemistry: Secondary | ICD-10-CM

## 2020-11-21 DIAGNOSIS — R946 Abnormal results of thyroid function studies: Secondary | ICD-10-CM | POA: Diagnosis not present

## 2020-11-21 LAB — COMPREHENSIVE METABOLIC PANEL
ALT: 19 U/L (ref 0–53)
AST: 17 U/L (ref 0–37)
Albumin: 4.3 g/dL (ref 3.5–5.2)
Alkaline Phosphatase: 62 U/L (ref 39–117)
BUN: 13 mg/dL (ref 6–23)
CO2: 31 mEq/L (ref 19–32)
Calcium: 9.3 mg/dL (ref 8.4–10.5)
Chloride: 105 mEq/L (ref 96–112)
Creatinine, Ser: 1.07 mg/dL (ref 0.40–1.50)
GFR: 85.86 mL/min (ref >60.00)
Glucose, Bld: 90 mg/dL (ref 70–99)
Potassium: 4.3 mEq/L (ref 3.5–5.1)
Sodium: 141 mEq/L (ref 135–145)
Total Bilirubin: 0.9 mg/dL (ref 0.2–1.2)
Total Protein: 6.8 g/dL (ref 6.0–8.3)

## 2020-11-21 LAB — LIPID PANEL
Cholesterol: 110 mg/dL (ref 0–200)
HDL: 38.2 mg/dL — ABNORMAL LOW (ref >39.00)
LDL Cholesterol: 52 mg/dL (ref 0–99)
NonHDL: 71.83
Total CHOL/HDL Ratio: 3
Triglycerides: 99 mg/dL (ref 0.0–149.0)
VLDL: 19.8 mg/dL (ref 0.0–40.0)

## 2020-11-21 LAB — TSH: TSH: 0.24 u[IU]/mL — ABNORMAL LOW (ref 0.35–4.50)

## 2020-11-21 LAB — HEMOGLOBIN A1C: Hgb A1c MFr Bld: 5.1 % (ref 4.6–6.5)

## 2020-11-21 NOTE — Addendum Note (Signed)
Addended by: Cloyd Stagers on: 11/21/2020 05:20 PM   Modules accepted: Orders

## 2020-11-24 LAB — T4, FREE: Free T4: 1.24 ng/dL (ref 0.60–1.60)

## 2020-11-25 ENCOUNTER — Encounter: Payer: Self-pay | Admitting: Family Medicine

## 2020-11-25 ENCOUNTER — Other Ambulatory Visit: Payer: Self-pay

## 2020-11-25 ENCOUNTER — Ambulatory Visit (INDEPENDENT_AMBULATORY_CARE_PROVIDER_SITE_OTHER): Payer: BC Managed Care – PPO | Admitting: Family Medicine

## 2020-11-25 VITALS — BP 124/70 | HR 75 | Temp 97.6°F | Ht 70.0 in | Wt 244.1 lb

## 2020-11-25 DIAGNOSIS — L723 Sebaceous cyst: Secondary | ICD-10-CM

## 2020-11-25 DIAGNOSIS — E039 Hypothyroidism, unspecified: Secondary | ICD-10-CM

## 2020-11-25 DIAGNOSIS — K7581 Nonalcoholic steatohepatitis (NASH): Secondary | ICD-10-CM

## 2020-11-25 DIAGNOSIS — Z6835 Body mass index (BMI) 35.0-35.9, adult: Secondary | ICD-10-CM

## 2020-11-25 DIAGNOSIS — R7303 Prediabetes: Secondary | ICD-10-CM

## 2020-11-25 DIAGNOSIS — Z0000 Encounter for general adult medical examination without abnormal findings: Secondary | ICD-10-CM | POA: Diagnosis not present

## 2020-11-25 DIAGNOSIS — E785 Hyperlipidemia, unspecified: Secondary | ICD-10-CM

## 2020-11-25 MED ORDER — LEVOTHYROXINE SODIUM 112 MCG PO TABS: 112.0000 ug | ORAL_TABLET | Freq: Every day | ORAL | 3 refills | Status: DC

## 2020-11-25 NOTE — Assessment & Plan Note (Signed)
Levels have normalized with more active lifestyle. Stable period off meds.  The ASCVD Risk score Mikey Bussing DC Jr., et al., 2013) failed to calculate for the following reasons:   The valid total cholesterol range is 130 to 320 mg/dL

## 2020-11-25 NOTE — Assessment & Plan Note (Signed)
Preventative protocols reviewed and updated unless pt declined. Discussed healthy diet and lifestyle.  

## 2020-11-25 NOTE — Patient Instructions (Addendum)
Thyroid was slightly over-treated - drop levothyroxine dose to 182mg daily - new dose at pharmacy. Schedule lab visit in 2-3 months for repeat thyroid check.  Return as needed or in 1 year for next physical.  You are doing well today   Health Maintenance, Male Adopting a healthy lifestyle and getting preventive care are important in promoting health and wellness. Ask your health care provider about:  The right schedule for you to have regular tests and exams.  Things you can do on your own to prevent diseases and keep yourself healthy. What should I know about diet, weight, and exercise? Eat a healthy diet  Eat a diet that includes plenty of vegetables, fruits, low-fat dairy products, and lean protein.  Do not eat a lot of foods that are high in solid fats, added sugars, or sodium.   Maintain a healthy weight Body mass index (BMI) is a measurement that can be used to identify possible weight problems. It estimates body fat based on height and weight. Your health care provider can help determine your BMI and help you achieve or maintain a healthy weight. Get regular exercise Get regular exercise. This is one of the most important things you can do for your health. Most adults should:  Exercise for at least 150 minutes each week. The exercise should increase your heart rate and make you sweat (moderate-intensity exercise).  Do strengthening exercises at least twice a week. This is in addition to the moderate-intensity exercise.  Spend less time sitting. Even light physical activity can be beneficial. Watch cholesterol and blood lipids Have your blood tested for lipids and cholesterol at 42years of age, then have this test every 5 years. You may need to have your cholesterol levels checked more often if:  Your lipid or cholesterol levels are high.  You are older than 42years of age.  You are at high risk for heart disease. What should I know about cancer screening? Many types of  cancers can be detected early and may often be prevented. Depending on your health history and family history, you may need to have cancer screening at various ages. This may include screening for:  Colorectal cancer.  Prostate cancer.  Skin cancer.  Lung cancer. What should I know about heart disease, diabetes, and high blood pressure? Blood pressure and heart disease  High blood pressure causes heart disease and increases the risk of stroke. This is more likely to develop in people who have high blood pressure readings, are of African descent, or are overweight.  Talk with your health care provider about your target blood pressure readings.  Have your blood pressure checked: ? Every 3-5 years if you are 17362years of age. ? Every year if you are 436years old or older.  If you are between the ages of 630and 759and are a current or former smoker, ask your health care provider if you should have a one-time screening for abdominal aortic aneurysm (AAA). Diabetes Have regular diabetes screenings. This checks your fasting blood sugar level. Have the screening done:  Once every three years after age 8437if you are at a normal weight and have a low risk for diabetes.  More often and at a younger age if you are overweight or have a high risk for diabetes. What should I know about preventing infection? Hepatitis B If you have a higher risk for hepatitis B, you should be screened for this virus. Talk with your health care provider  to find out if you are at risk for hepatitis B infection. Hepatitis C Blood testing is recommended for:  Everyone born from [birth year] through 1967.  Anyone with known risk factors for hepatitis C. Sexually transmitted infections (STIs)  You should be screened each year for STIs, including gonorrhea and chlamydia, if: ? You are sexually active and are younger than 42 years of age. ? You are older than 42 years of age and your health care provider tells you that you  are at risk for this type of infection. ? Your sexual activity has changed since you were last screened, and you are at increased risk for chlamydia or gonorrhea. Ask your health care provider if you are at risk.  Ask your health care provider about whether you are at high risk for HIV. Your health care provider may recommend a prescription medicine to help prevent HIV infection. If you choose to take medicine to prevent HIV, you should first get tested for HIV. You should then be tested every 3 months for as long as you are taking the medicine. Follow these instructions at home: Lifestyle  Do not use any products that contain nicotine or tobacco, such as cigarettes, e-cigarettes, and chewing tobacco. If you need help quitting, ask your health care provider.  Do not use street drugs.  Do not share needles.  Ask your health care provider for help if you need support or information about quitting drugs. Alcohol use  Do not drink alcohol if your health care provider tells you not to drink.  If you drink alcohol: ? Limit how much you have to 0-2 drinks a day. ? Be aware of how much alcohol is in your drink. In the [place] one drink equals one 12 oz bottle of beer (355 mL), one 5 oz glass of wine (148 mL), or one 1 oz glass of hard liquor (44 mL). General instructions  Schedule regular health, dental, and eye exams.  Stay current with your vaccines.  Tell your health care provider if: ? You often feel depressed. ? You have ever been abused or do not feel safe at home. Summary  Adopting a healthy lifestyle and getting preventive care are important in promoting health and wellness.  Follow your health care provider's instructions about healthy diet, exercising, and getting tested or screened for diseases.  Follow your health care provider's instructions on monitoring your cholesterol and blood pressure. This information is not intended to replace advice given to you by your health care  provider. Make sure you discuss any questions you have with your health care provider. Document Revised: 09/13/2018 Document Reviewed: 09/13/2018 Elsevier Patient Education  2021 [employer].

## 2020-11-25 NOTE — Assessment & Plan Note (Signed)
TSH slightly elevated, fT4 normal. Drop levothyroxine to 18mg, recheck levels in 2-3 months.

## 2020-11-25 NOTE — Assessment & Plan Note (Signed)
Congratulated on weight loss to date. Encouraged ongoing efforts for healthy diet/lifestyle

## 2020-11-25 NOTE — Assessment & Plan Note (Signed)
This has resolved with healthier diet/lifestyle - congratulated

## 2020-11-25 NOTE — Progress Notes (Signed)
Patient ID: Antonio Doyle, male    DOB: 1981-04-03, 40 y.o.   MRN: [id number]  This visit was conducted in person.  BP 124/70   Pulse 75   Temp 97.6 F (36.4 C) (Temporal)   Ht 5' 10"  (1.778 m)   Wt 244 lb 2 oz (110.7 kg)   SpO2 97%   BMI 35.03 kg/m    CC: CPE Subjective:   HPI: Antonio Doyle is a 40 y.o. male presenting on 11/25/2020 for Annual Exam   Lost mom 07/2020 - she had been chronically ill after multiple failed back surgeries.   Preventative: Flu shot - allergic to eggs - has declined flu blok  COVID vaccine Pfizer 01/2020, 02/2020 Td ~2010. Tdap 09/2019 Seat belt use discussed.  Sunscreen use discussed. No changing moles on skin. 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 Interim medical history since our last visit reviewed. Allergies and medications reviewed and updated. Outpatient Medications Prior to Visit  Medication Sig Dispense Refill  . levothyroxine (SYNTHROID) 125 MCG tablet TAKE 1 TABLET BY MOUTH EVERY DAY 90 tablet 0   No facility-administered medications prior to visit.     Per HPI unless specifically indicated in ROS section below Review of Systems  Constitutional: Negative for activity change, appetite change, chills, fatigue, fever and unexpected weight change.  HENT: Negative for hearing loss.   Eyes: Negative for visual disturbance.  Respiratory: Negative for cough, chest tightness, shortness of breath and wheezing.   Cardiovascular: Negative for chest pain, palpitations and leg swelling.  Gastrointestinal: Negative for abdominal distention, abdominal  pain, blood in stool, constipation, diarrhea, nausea and vomiting.  Genitourinary: Negative for difficulty urinating and hematuria.  Musculoskeletal: Negative for arthralgias, myalgias and neck pain.  Skin: Negative for rash.  Neurological: Negative for dizziness, seizures, syncope and headaches.  Hematological: Negative for adenopathy. Does not bruise/bleed easily.  Psychiatric/Behavioral: Negative for dysphoric mood. The patient is not nervous/anxious.    Objective:  BP 124/70   Pulse 75   Temp 97.6 F (36.4 C) (Temporal)   Ht 5' 10"  (1.778 m)   Wt 244 lb 2 oz (110.7 kg)   SpO2 97%   BMI 35.03 kg/m   Wt Readings from Last 3 Encounters:  11/25/20 244 lb 2 oz (110.7 kg)  09/18/19 249 lb 9 oz (113.2 kg)  05/29/19 250 lb 3 oz (113.5 kg)      Physical Exam Vitals and nursing note reviewed.  Constitutional:      General: He is not in acute distress.    Appearance: Normal appearance. He is well-developed and well-nourished. He is not ill-appearing.  HENT:     Head: Normocephalic and atraumatic.     Right Ear: Hearing, tympanic membrane, ear canal and external ear normal.     Left Ear: Hearing, tympanic membrane, ear canal and external ear normal.     Mouth/Throat:     Mouth: Oropharynx is clear and moist and mucous membranes are normal.     Pharynx: No posterior oropharyngeal edema.  Eyes:     General: No scleral icterus.    Extraocular Movements: Extraocular movements  intact and EOM normal.     Conjunctiva/sclera: Conjunctivae normal.     Pupils: Pupils are equal, round, and reactive to light.  Neck:     Thyroid: No thyroid mass or thyromegaly.  Cardiovascular:     Rate and Rhythm: Normal rate and regular rhythm.     Pulses: Normal pulses and intact distal pulses.          Radial pulses are 2+ on the right side and 2+ on the left side.     Heart sounds: Normal heart sounds. No murmur heard.   Pulmonary:     Effort: Pulmonary effort is normal. No respiratory distress.      Breath sounds: Normal breath sounds. No wheezing, rhonchi or rales.  Abdominal:     General: Abdomen is flat. Bowel sounds are normal. There is no distension.     Palpations: Abdomen is soft. There is no mass.     Tenderness: There is no abdominal tenderness. There is no guarding or rebound.     Hernia: No hernia is present.  Musculoskeletal:        General: No edema. Normal range of motion.     Cervical back: Normal range of motion and neck supple.     Right lower leg: No edema.     Left lower leg: No edema.  Lymphadenopathy:     Cervical: No cervical adenopathy.  Skin:    General: Skin is warm and dry.     Findings: No rash.     Comments: Multiple sebaceous cysts to back, largest one left posterior neck, none inflamed or warm.   Neurological:     General: No focal deficit present.     Mental Status: He is alert and oriented to person, place, and time.     Comments: CN grossly intact, station and gait intact  Psychiatric:        Mood and Affect: Mood and affect and mood normal.        Behavior: Behavior normal.        Thought Content: Thought content normal.        Judgment: Judgment normal.       Results for orders placed or performed in visit on 11/21/20  Hemoglobin A1c  Result Value Ref Range   Hgb A1c MFr Bld 5.1 4.6 - 6.5 %  TSH  Result Value Ref Range   TSH 0.24 (L) 0.35 - 4.50 uIU/mL  Comprehensive metabolic panel  Result Value Ref Range   Sodium 141 135 - 145 mEq/L   Potassium 4.3 3.5 - 5.1 mEq/L   Chloride 105 96 - 112 mEq/L   CO2 31 19 - 32 mEq/L   Glucose, Bld 90 70 - 99 mg/dL   BUN 13 6 - 23 mg/dL   Creatinine, Ser 1.07 0.40 - 1.50 mg/dL   Total Bilirubin 0.9 0.2 - 1.2 mg/dL   Alkaline Phosphatase 62 39 - 117 U/L   AST 17 0 - 37 U/L   ALT 19 0 - 53 U/L   Total Protein 6.8 6.0 - 8.3 g/dL   Albumin 4.3 3.5 - 5.2 g/dL   GFR 85.86 >60.00 mL/min   Calcium 9.3 8.4 - 10.5 mg/dL  Lipid panel  Result Value Ref Range   Cholesterol 110 0 - 200 mg/dL    Triglycerides 99.0 0.0 - 149.0 mg/dL   HDL 38.20 (L) >39.00 mg/dL   VLDL 19.8 0.0 - 40.0 mg/dL   LDL Cholesterol 52 0 - 99 mg/dL   Total  CHOL/HDL Ratio 3    NonHDL 71.83   T4, free  Result Value Ref Range   Free T4 1.24 0.60 - 1.60 ng/dL   Assessment & Plan:  This visit occurred during the SARS-CoV-2 public health emergency.  Safety protocols were in place, including screening questions prior to the visit, additional usage of staff PPE, and extensive cleaning of exam room while observing appropriate contact time as indicated for disinfecting solutions.   Problem List Items Addressed This Visit    Severe obesity (BMI 35.0-35.9 with comorbidity) ([place])    Congratulated on weight loss to date. Encouraged ongoing efforts for healthy diet/lifestyle       Sebaceous cyst    Multiple on back, largest one on right posterior neck, none actively inflamed/infected. Offered derm or gen surg referral for excision, pt declines at this time. Aware of chance of recurrent infection.       Prediabetes    This has resolved with healthier diet/lifestyle - congratulated      NASH (nonalcoholic steatohepatitis)    H/o this, LFTs normal. Continue to monitor.       Hypothyroidism    TSH slightly elevated, fT4 normal. Drop levothyroxine to 155mg, recheck levels in 2-3 months.       Relevant Medications   levothyroxine (SYNTHROID) 112 MCG tablet   Other Relevant Orders   TSH   Healthcare maintenance - Primary    Preventative protocols reviewed and updated unless pt declined. Discussed healthy diet and lifestyle.       Dyslipidemia    Levels have normalized with more active lifestyle. Stable period off meds.  The ASCVD Risk score (Mikey BussingDC Jr., et al., 2013) failed to calculate for the following reasons:   The valid total cholesterol range is 130 to 320 mg/dL           Meds ordered this encounter  Medications  . levothyroxine (SYNTHROID) 112 MCG tablet    Sig: Take 1 tablet (112 mcg total)  by mouth daily.    Dispense:  90 tablet    Refill:  3   Orders Placed This Encounter  Procedures  . TSH    Standing Status:   Future    Standing Expiration Date:   11/25/2021    Patient instructions: Thyroid was slightly over-treated - drop levothyroxine dose to 1184m daily - new dose at pharmacy. Schedule lab visit in 2-3 months for repeat thyroid check.  Return as needed or in 1 year for next physical.  You are doing well today   Follow up plan: Return in about 1 year (around 11/25/2021) for annual exam, prior fasting for blood work.  JaRia BushMD

## 2020-11-25 NOTE — Assessment & Plan Note (Signed)
H/o this, LFTs normal. Continue to monitor.

## 2020-11-25 NOTE — Assessment & Plan Note (Signed)
Multiple on back, largest one on right posterior neck, none actively inflamed/infected. Offered derm or gen surg referral for excision, pt declines at this time. Aware of chance of recurrent infection.

## 2020-12-12 ENCOUNTER — Encounter: Payer: Self-pay | Admitting: Family Medicine

## 2020-12-12 DIAGNOSIS — L02232 Carbuncle of back [any part, except buttock]: Secondary | ICD-10-CM | POA: Diagnosis not present

## 2020-12-12 DIAGNOSIS — L723 Sebaceous cyst: Secondary | ICD-10-CM

## 2020-12-16 ENCOUNTER — Other Ambulatory Visit: Payer: Self-pay | Admitting: Family Medicine

## 2020-12-17 DIAGNOSIS — E079 Disorder of thyroid, unspecified: Secondary | ICD-10-CM | POA: Diagnosis not present

## 2020-12-17 DIAGNOSIS — L02212 Cutaneous abscess of back [any part, except buttock]: Secondary | ICD-10-CM | POA: Diagnosis not present

## 2020-12-17 DIAGNOSIS — L0291 Cutaneous abscess, unspecified: Secondary | ICD-10-CM | POA: Diagnosis not present

## 2020-12-17 DIAGNOSIS — R222 Localized swelling, mass and lump, trunk: Secondary | ICD-10-CM | POA: Diagnosis not present

## 2020-12-17 DIAGNOSIS — Z79890 Hormone replacement therapy (postmenopausal): Secondary | ICD-10-CM | POA: Diagnosis not present

## 2020-12-17 DIAGNOSIS — R079 Chest pain, unspecified: Secondary | ICD-10-CM | POA: Diagnosis not present

## 2020-12-19 DIAGNOSIS — Z91012 Allergy to eggs: Secondary | ICD-10-CM | POA: Diagnosis not present

## 2020-12-19 DIAGNOSIS — L02212 Cutaneous abscess of back [any part, except buttock]: Secondary | ICD-10-CM | POA: Diagnosis not present

## 2020-12-19 DIAGNOSIS — R61 Generalized hyperhidrosis: Secondary | ICD-10-CM | POA: Diagnosis not present

## 2020-12-19 DIAGNOSIS — E079 Disorder of thyroid, unspecified: Secondary | ICD-10-CM | POA: Diagnosis not present

## 2020-12-21 DIAGNOSIS — Z91012 Allergy to eggs: Secondary | ICD-10-CM | POA: Diagnosis not present

## 2020-12-21 DIAGNOSIS — M7989 Other specified soft tissue disorders: Secondary | ICD-10-CM | POA: Diagnosis not present

## 2020-12-21 DIAGNOSIS — E079 Disorder of thyroid, unspecified: Secondary | ICD-10-CM | POA: Diagnosis not present

## 2020-12-21 DIAGNOSIS — L02212 Cutaneous abscess of back [any part, except buttock]: Secondary | ICD-10-CM | POA: Diagnosis not present

## 2020-12-21 DIAGNOSIS — L539 Erythematous condition, unspecified: Secondary | ICD-10-CM | POA: Diagnosis not present

## 2021-01-05 DIAGNOSIS — R221 Localized swelling, mass and lump, neck: Secondary | ICD-10-CM | POA: Diagnosis not present

## 2021-01-05 DIAGNOSIS — L720 Epidermal cyst: Secondary | ICD-10-CM | POA: Diagnosis not present

## 2021-01-23 ENCOUNTER — Other Ambulatory Visit: Payer: Self-pay

## 2021-01-23 ENCOUNTER — Other Ambulatory Visit (INDEPENDENT_AMBULATORY_CARE_PROVIDER_SITE_OTHER): Payer: BC Managed Care – PPO

## 2021-01-23 DIAGNOSIS — E039 Hypothyroidism, unspecified: Secondary | ICD-10-CM

## 2021-01-23 LAB — TSH: TSH: 0.8 u[IU]/mL (ref 0.35–4.50)

## 2021-01-26 DIAGNOSIS — D492 Neoplasm of unspecified behavior of bone, soft tissue, and skin: Secondary | ICD-10-CM | POA: Diagnosis not present

## 2021-01-26 DIAGNOSIS — L720 Epidermal cyst: Secondary | ICD-10-CM | POA: Diagnosis not present

## 2021-03-16 DIAGNOSIS — R221 Localized swelling, mass and lump, neck: Secondary | ICD-10-CM | POA: Diagnosis not present

## 2021-05-04 HISTORY — PX: CYST EXCISION: SHX5701

## 2021-05-19 ENCOUNTER — Encounter: Payer: Self-pay | Admitting: Family Medicine

## 2021-05-26 DIAGNOSIS — R221 Localized swelling, mass and lump, neck: Secondary | ICD-10-CM | POA: Diagnosis not present

## 2021-05-26 DIAGNOSIS — Z6834 Body mass index (BMI) 34.0-34.9, adult: Secondary | ICD-10-CM | POA: Diagnosis not present

## 2021-06-03 ENCOUNTER — Encounter: Payer: Self-pay | Admitting: Family Medicine

## 2021-06-03 DIAGNOSIS — L720 Epidermal cyst: Secondary | ICD-10-CM | POA: Diagnosis not present

## 2021-06-03 DIAGNOSIS — K7581 Nonalcoholic steatohepatitis (NASH): Secondary | ICD-10-CM | POA: Diagnosis not present

## 2021-06-03 DIAGNOSIS — R221 Localized swelling, mass and lump, neck: Secondary | ICD-10-CM | POA: Diagnosis not present

## 2021-06-03 DIAGNOSIS — Z79890 Hormone replacement therapy (postmenopausal): Secondary | ICD-10-CM | POA: Diagnosis not present

## 2021-06-03 DIAGNOSIS — E039 Hypothyroidism, unspecified: Secondary | ICD-10-CM | POA: Diagnosis not present

## 2021-06-12 DIAGNOSIS — Z4889 Encounter for other specified surgical aftercare: Secondary | ICD-10-CM | POA: Diagnosis not present

## 2021-06-12 DIAGNOSIS — Z09 Encounter for follow-up examination after completed treatment for conditions other than malignant neoplasm: Secondary | ICD-10-CM | POA: Diagnosis not present

## 2021-10-19 ENCOUNTER — Telehealth: Payer: Self-pay | Admitting: Family Medicine

## 2021-11-18 ENCOUNTER — Other Ambulatory Visit: Payer: BC Managed Care – PPO

## 2021-11-20 ENCOUNTER — Other Ambulatory Visit: Payer: Self-pay | Admitting: Family Medicine

## 2021-11-25 ENCOUNTER — Encounter: Payer: BC Managed Care – PPO | Admitting: Family Medicine

## 2021-11-25 ENCOUNTER — Other Ambulatory Visit: Payer: Self-pay | Admitting: Family Medicine

## 2021-11-25 MED ORDER — LEVOTHYROXINE SODIUM 112 MCG PO TABS: 112.0000 ug | ORAL_TABLET | Freq: Every day | ORAL | 0 refills | Status: DC

## 2022-01-18 ENCOUNTER — Other Ambulatory Visit: Payer: Self-pay | Admitting: Family Medicine

## 2022-01-18 ENCOUNTER — Other Ambulatory Visit (INDEPENDENT_AMBULATORY_CARE_PROVIDER_SITE_OTHER): Payer: BC Managed Care – PPO

## 2022-01-18 DIAGNOSIS — E785 Hyperlipidemia, unspecified: Secondary | ICD-10-CM | POA: Diagnosis not present

## 2022-01-18 DIAGNOSIS — R7303 Prediabetes: Secondary | ICD-10-CM

## 2022-01-18 DIAGNOSIS — E039 Hypothyroidism, unspecified: Secondary | ICD-10-CM

## 2022-01-18 DIAGNOSIS — K7581 Nonalcoholic steatohepatitis (NASH): Secondary | ICD-10-CM

## 2022-01-18 LAB — LIPID PANEL
Cholesterol: 105 mg/dL (ref 0–200)
HDL: 35.1 mg/dL — ABNORMAL LOW (ref >39.00)
LDL Cholesterol: 48 mg/dL (ref 0–99)
NonHDL: 70.37
Total CHOL/HDL Ratio: 3
Triglycerides: 110 mg/dL (ref 0.0–149.0)
VLDL: 22 mg/dL (ref 0.0–40.0)

## 2022-01-18 LAB — HEMOGLOBIN A1C: Hgb A1c MFr Bld: 5.4 % (ref 4.6–6.5)

## 2022-01-18 LAB — COMPREHENSIVE METABOLIC PANEL
ALT: 21 U/L (ref 0–53)
AST: 16 U/L (ref 0–37)
Albumin: 4.5 g/dL (ref 3.5–5.2)
Alkaline Phosphatase: 63 U/L (ref 39–117)
BUN: 15 mg/dL (ref 6–23)
CO2: 30 mEq/L (ref 19–32)
Calcium: 9.4 mg/dL (ref 8.4–10.5)
Chloride: 104 mEq/L (ref 96–112)
Creatinine, Ser: 1.11 mg/dL (ref 0.40–1.50)
GFR: 81.5 mL/min (ref >60.00)
Glucose, Bld: 95 mg/dL (ref 70–99)
Potassium: 4.6 mEq/L (ref 3.5–5.1)
Sodium: 139 mEq/L (ref 135–145)
Total Bilirubin: 0.6 mg/dL (ref 0.2–1.2)
Total Protein: 7 g/dL (ref 6.0–8.3)

## 2022-01-18 LAB — PHOSPHORUS: Phosphorus: 3.2 mg/dL (ref 2.3–4.6)

## 2022-01-18 LAB — VITAMIN D 25 HYDROXY (VIT D DEFICIENCY, FRACTURES): VITD: 23.56 ng/mL — ABNORMAL LOW (ref 30.00–100.00)

## 2022-01-18 LAB — TSH: TSH: 0.22 u[IU]/mL — ABNORMAL LOW (ref 0.35–5.50)

## 2022-01-25 ENCOUNTER — Encounter: Payer: Self-pay | Admitting: Family Medicine

## 2022-01-25 ENCOUNTER — Ambulatory Visit (INDEPENDENT_AMBULATORY_CARE_PROVIDER_SITE_OTHER): Payer: BC Managed Care – PPO | Admitting: Family Medicine

## 2022-01-25 DIAGNOSIS — R7303 Prediabetes: Secondary | ICD-10-CM | POA: Diagnosis not present

## 2022-01-25 DIAGNOSIS — K7581 Nonalcoholic steatohepatitis (NASH): Secondary | ICD-10-CM

## 2022-01-25 DIAGNOSIS — E669 Obesity, unspecified: Secondary | ICD-10-CM

## 2022-01-25 DIAGNOSIS — E785 Hyperlipidemia, unspecified: Secondary | ICD-10-CM | POA: Diagnosis not present

## 2022-01-25 DIAGNOSIS — Z0000 Encounter for general adult medical examination without abnormal findings: Secondary | ICD-10-CM | POA: Diagnosis not present

## 2022-01-25 DIAGNOSIS — E039 Hypothyroidism, unspecified: Secondary | ICD-10-CM

## 2022-01-25 DIAGNOSIS — E559 Vitamin D deficiency, unspecified: Secondary | ICD-10-CM

## 2022-01-25 MED ORDER — VITAMIN D3 25 MCG (1000 UT) PO CAPS: 1.0000 | ORAL_CAPSULE | Freq: Every day | ORAL | Status: AC

## 2022-01-25 MED ORDER — LEVOTHYROXINE SODIUM 100 MCG PO TABS: 100.0000 ug | ORAL_TABLET | Freq: Every day | ORAL | 3 refills | Status: DC

## 2022-01-25 NOTE — Assessment & Plan Note (Signed)
Based on prior MRI. LFTs normal. Anticipate ongoing good control with recent weight loss.  ?

## 2022-01-25 NOTE — Assessment & Plan Note (Signed)
Chronic, TSH low - will drop levothyroxine again to 11mg daily, reassess in 6 wks.  ?

## 2022-01-25 NOTE — Assessment & Plan Note (Signed)
A1c remains normal range - continue to monitor.  ?

## 2022-01-25 NOTE — Progress Notes (Signed)
? ? Patient ID: Antonio Doyle, male    DOB: 06-19-1981, 41 y.o.   MRN: [id number] ? ?This visit was conducted in person. ? ?BP 126/70   Pulse (!) 57   Temp 97.9 ?F (36.6 ?C) (Temporal)   Ht 5' 10"  (1.778 m)   Wt 237 lb (107.5 kg)   SpO2 96%   BMI 34.01 kg/m?   ? ?CC: CPE ?Subjective:  ? ?HPI: ?Antonio Doyle is a 41 y.o. male presenting on 01/25/2022 for Annual Exam ? ? ?Hypothyroidism - daily levothyroxine 119mg daily. Notes he wakes up at 3am but is then able to fall asleep. Notes heat intolerance. He's been exercising more regularly - runs 3 days a week and lifts weights 3 days a week.  ? ?Preventative: ?Flu shot - allergic to eggs - has declined flu blok  ?CHendrum4/2021, 02/2020, booster 02/2021 ?Td ~2010. Tdap 09/2019. ?Seat belt use discussed.  ?Sunscreen use discussed. No changing moles on skin.  ?Sleep - averaging 7-8 hours/night ?Non smoker  ?Alcohol - none  ?Dentist yearly ?Eye exam - new glasses ? ?Caffeine: none ?Lives with wife and 2 daughters, 2 dogs  ?Occupation: [place] [place]  ?Edu: college  ?Activity: gym 3x/wk, park 3x/wk (2-3 miles) ?Diet: good water, fruits/vegetables daily - eating oatmeal  ?   ? ?Relevant past medical, surgical, family and social history reviewed and updated as indicated. Interim medical history since our last visit reviewed. ?Allergies and medications reviewed and updated. ?Outpatient Medications Prior to Visit  ?Medication Sig Dispense Refill  ? levothyroxine (SYNTHROID) 112 MCG tablet Take 1 tablet (112 mcg total) by mouth daily. 90 tablet 0  ? ?No facility-administered medications prior to visit.  ?  ? ?Per HPI unless specifically indicated in ROS section below ?Review of Systems  ?Constitutional:  Negative for activity change, appetite change, chills, fatigue, fever and unexpected weight change.  ?HENT:  Negative for hearing loss.   ?Eyes:  Negative for visual disturbance.  ?Respiratory:  Negative  for cough, chest tightness, shortness of breath and wheezing.   ?Cardiovascular:  Negative for chest pain, palpitations and leg swelling.  ?Gastrointestinal:  Negative for abdominal distention, abdominal pain, blood in stool, constipation, diarrhea, nausea and vomiting.  ?Genitourinary:  Negative for difficulty urinating and hematuria.  ?Musculoskeletal:  Negative for arthralgias, myalgias and neck pain.  ?Skin:  Negative for rash.  ?Neurological:  Negative for dizziness, seizures, syncope and headaches.  ?Hematological:  Negative for adenopathy. Does not bruise/bleed easily.  ?Psychiatric/Behavioral:  Negative for dysphoric mood. The patient is not nervous/anxious.   ? ?Objective:  ?BP 126/70   Pulse (!) 57   Temp 97.9 ?F (36.6 ?C) (Temporal)   Ht 5' 10"  (1.778 m)   Wt 237 lb (107.5 kg)   SpO2 96%   BMI 34.01 kg/m?   ?Wt Readings from Last 3 Encounters:  ?01/25/22 237 lb (107.5 kg)  ?11/25/20 244 lb 2 oz (110.7 kg)  ?09/18/19 249 lb 9 oz (113.2 kg)  ?  ?  ?Physical Exam ?Vitals and nursing note reviewed.  ?Constitutional:   ?   General: He is not in acute distress. ?   Appearance: Normal appearance. He is well-developed. He is not ill-appearing.  ?HENT:  ?   Head: Normocephalic and atraumatic.  ?   Right Ear: Hearing, tympanic membrane, ear canal and external ear normal.  ?   Left Ear: Hearing, tympanic membrane, ear canal and external ear normal.  ?  Eyes:  ?   General: No scleral icterus. ?   Extraocular Movements: Extraocular movements intact.  ?   Conjunctiva/sclera: Conjunctivae normal.  ?   Pupils: Pupils are equal, round, and reactive to light.  ?Neck:  ?   Thyroid: No thyroid mass or thyromegaly.  ?Cardiovascular:  ?   Rate and Rhythm: Normal rate and regular rhythm.  ?   Pulses: Normal pulses.     ?     Radial pulses are 2+ on the right side and 2+ on the left side.  ?   Heart sounds: Normal heart sounds. No murmur heard. ?Pulmonary:  ?   Effort: Pulmonary effort is normal. No respiratory distress.  ?    Breath sounds: Normal breath sounds. No wheezing, rhonchi or rales.  ?Abdominal:  ?   General: Bowel sounds are normal. There is no distension.  ?   Palpations: Abdomen is soft. There is no mass.  ?   Tenderness: There is no abdominal tenderness. There is no guarding or rebound.  ?   Hernia: No hernia is present.  ?Musculoskeletal:     ?   General: Normal range of motion.  ?   Cervical back: Normal range of motion and neck supple.  ?   Right lower leg: No edema.  ?   Left lower leg: No edema.  ?Lymphadenopathy:  ?   Cervical: No cervical adenopathy.  ?Skin: ?   General: Skin is warm and dry.  ?   Findings: No rash.  ?Neurological:  ?   General: No focal deficit present.  ?   Mental Status: He is alert and oriented to person, place, and time.  ?Psychiatric:     ?   Mood and Affect: Mood normal.     ?   Behavior: Behavior normal.     ?   Thought Content: Thought content normal.     ?   Judgment: Judgment normal.  ? ?   ?Results for orders placed or performed in visit on 01/18/22  ?Phosphorus  ?Result Value Ref Range  ? Phosphorus 3.2 2.3 - 4.6 mg/dL  ?VITAMIN D 25 Hydroxy (Vit-D Deficiency, Fractures)  ?Result Value Ref Range  ? VITD 23.56 (L) 30.00 - 100.00 ng/mL  ?Hemoglobin A1c  ?Result Value Ref Range  ? Hgb A1c MFr Bld 5.4 4.6 - 6.5 %  ?TSH  ?Result Value Ref Range  ? TSH 0.22 (L) 0.35 - 5.50 uIU/mL  ?Comprehensive metabolic panel  ?Result Value Ref Range  ? Sodium 139 135 - 145 mEq/L  ? Potassium 4.6 3.5 - 5.1 mEq/L  ? Chloride 104 96 - 112 mEq/L  ? CO2 30 19 - 32 mEq/L  ? Glucose, Bld 95 70 - 99 mg/dL  ? BUN 15 6 - 23 mg/dL  ? Creatinine, Ser 1.11 0.40 - 1.50 mg/dL  ? Total Bilirubin 0.6 0.2 - 1.2 mg/dL  ? Alkaline Phosphatase 63 39 - 117 U/L  ? AST 16 0 - 37 U/L  ? ALT 21 0 - 53 U/L  ? Total Protein 7.0 6.0 - 8.3 g/dL  ? Albumin 4.5 3.5 - 5.2 g/dL  ? GFR 81.50 >60.00 mL/min  ? Calcium 9.4 8.4 - 10.5 mg/dL  ?Lipid panel  ?Result Value Ref Range  ? Cholesterol 105 0 - 200 mg/dL  ? Triglycerides 110.0 0.0 -  149.0 mg/dL  ? HDL 35.10 (L) >39.00 mg/dL  ? VLDL 22.0 0.0 - 40.0 mg/dL  ? LDL Cholesterol 48 0 - 99 mg/dL  ?  Total CHOL/HDL Ratio 3   ? NonHDL 70.37   ? ? ?Assessment & Plan:  ? ?Problem List Items Addressed This Visit   ? ? Healthcare maintenance (Chronic)  ?  Preventative protocols reviewed and updated unless pt declined. ?Discussed healthy diet and lifestyle.  ? ?  ?  ? Hypothyroidism  ?  Chronic, TSH low - will drop levothyroxine again to 118mg daily, reassess in 6 wks.  ? ?  ?  ? Relevant Medications  ? levothyroxine (SYNTHROID) 100 MCG tablet  ? Dyslipidemia  ?  Chronic, overall great control except for low HDL - encouraged cardio to improve levels. ?The ASCVD Risk score (Arnett DK, et al., 2019) failed to calculate for the following reasons: ?  The valid total cholesterol range is 130 to 320 mg/dL  ? ?  ?  ? Prediabetes  ?  A1c remains normal range - continue to monitor.  ? ?  ?  ? Obesity, Class I, BMI 30-34.9  ?  Congratulated on ongoing healthy diet and lifestyle choices for goal sustainable weight loss. He is motivated to continue regular exercise routine.  ? ?  ?  ? NASH (nonalcoholic steatohepatitis)  ?  Based on prior MRI. LFTs normal. Anticipate ongoing good control with recent weight loss.  ? ?  ?  ? Vitamin D deficiency  ?  rec start vit D3 1000 IU daily.  ? ?  ?  ?  ? ?Meds ordered this encounter  ?Medications  ? levothyroxine (SYNTHROID) 100 MCG tablet  ?  Sig: Take 1 tablet (100 mcg total) by mouth daily.  ?  Dispense:  90 tablet  ?  Refill:  3  ? Cholecalciferol (VITAMIN D3) 25 MCG (1000 UT) CAPS  ?  Sig: Take 1 capsule (1,000 Units total) by mouth daily.  ?  Dispense:  30 capsule  ? ?No orders of the defined types were placed in this encounter. ? ? ? ?Patient instructions: ?Start vitamin D3 1000 units daily over the counter ?Drop levothyroxine dose to 1052m daily - new dose at pharmacy. Return in 6 weeks for labs to retest thyroid levels.  ?Return in 1 year for next physical.  ? ?Follow up  plan: ?Return in about 1 year (around 01/26/2023) for annual exam, prior fasting for blood work. ? ?JaRia BushMD   ?

## 2022-01-25 NOTE — Assessment & Plan Note (Signed)
Chronic, overall great control except for low HDL - encouraged cardio to improve levels. ?The ASCVD Risk score (Arnett DK, et al., 2019) failed to calculate for the following reasons: ?  The valid total cholesterol range is 130 to 320 mg/dL  ?

## 2022-01-25 NOTE — Assessment & Plan Note (Signed)
Congratulated on ongoing healthy diet and lifestyle choices for goal sustainable weight loss. He is motivated to continue regular exercise routine.  ?

## 2022-01-25 NOTE — Patient Instructions (Addendum)
Start vitamin D3 1000 units daily over the counter ?Drop levothyroxine dose to 196mg daily - new dose at pharmacy. Return in 6 weeks for labs to retest thyroid levels.  ?Return in 1 year for next physical.  ? ?Health Maintenance, Male ?Adopting a healthy lifestyle and getting preventive care are important in promoting health and wellness. Ask your health care provider about: ?The right schedule for you to have regular tests and exams. ?Things you can do on your own to prevent diseases and keep yourself healthy. ?What should I know about diet, weight, and exercise? ?Eat a healthy diet ? ?Eat a diet that includes plenty of vegetables, fruits, low-fat dairy products, and lean protein. ?Do not eat a lot of foods that are high in solid fats, added sugars, or sodium. ?Maintain a healthy weight ?Body mass index (BMI) is a measurement that can be used to identify possible weight problems. It estimates body fat based on height and weight. Your health care provider can help determine your BMI and help you achieve or maintain a healthy weight. ?Get regular exercise ?Get regular exercise. This is one of the most important things you can do for your health. Most adults should: ?Exercise for at least 150 minutes each week. The exercise should increase your heart rate and make you sweat (moderate-intensity exercise). ?Do strengthening exercises at least twice a week. This is in addition to the moderate-intensity exercise. ?Spend less time sitting. Even light physical activity can be beneficial. ?Watch cholesterol and blood lipids ?Have your blood tested for lipids and cholesterol at 43years of age, then have this test every 5 years. ?You may need to have your cholesterol levels checked more often if: ?Your lipid or cholesterol levels are high. ?You are older than 43years of age. ?You are at high risk for heart disease. ?What should I know about cancer screening? ?Many types of cancers can be detected early and may often be  prevented. Depending on your health history and family history, you may need to have cancer screening at various ages. This may include screening for: ?Colorectal cancer. ?Prostate cancer. ?Skin cancer. ?Lung cancer. ?What should I know about heart disease, diabetes, and high blood pressure? ?Blood pressure and heart disease ?High blood pressure causes heart disease and increases the risk of stroke. This is more likely to develop in people who have high blood pressure readings or are overweight. ?Talk with your health care provider about your target blood pressure readings. ?Have your blood pressure checked: ?Every 3-5 years if you are 141344years of age. ?Every year if you are 440years old or older. ?If you are between the ages of 637and 742and are a current or former smoker, ask your health care provider if you should have a one-time screening for abdominal aortic aneurysm (AAA). ?Diabetes ?Have regular diabetes screenings. This checks your fasting blood sugar level. Have the screening done: ?Once every three years after age 7082if you are at a normal weight and have a low risk for diabetes. ?More often and at a younger age if you are overweight or have a high risk for diabetes. ?What should I know about preventing infection? ?Hepatitis B ?If you have a higher risk for hepatitis B, you should be screened for this virus. Talk with your health care provider to find out if you are at risk for hepatitis B infection. ?Hepatitis C ?Blood testing is recommended for: ?Everyone born from [birth year]through 1967. ?Anyone with known risk factors for hepatitis  C. ?Sexually transmitted infections (STIs) ?You should be screened each year for STIs, including gonorrhea and chlamydia, if: ?You are sexually active and are younger than 43 years of age. ?You are older than 43 years of age and your health care provider tells you that you are at risk for this type of infection. ?Your sexual activity has changed since you were last screened,  and you are at increased risk for chlamydia or gonorrhea. Ask your health care provider if you are at risk. ?Ask your health care provider about whether you are at high risk for HIV. Your health care provider may recommend a prescription medicine to help prevent HIV infection. If you choose to take medicine to prevent HIV, you should first get tested for HIV. You should then be tested every 3 months for as long as you are taking the medicine. ?Follow these instructions at home: ?Alcohol use ?Do not drink alcohol if your health care provider tells you not to drink. ?If you drink alcohol: ?Limit how much you have to 0-2 drinks a day. ?Know how much alcohol is in your drink. In the [place] one drink equals one 12 oz bottle of beer (355 mL), one 5 oz glass of wine (148 mL), or one 1? oz glass of hard liquor (44 mL). ?Lifestyle ?Do not use any products that contain nicotine or tobacco. These products include cigarettes, chewing tobacco, and vaping devices, such as e-cigarettes. If you need help quitting, ask your health care provider. ?Do not use street drugs. ?Do not share needles. ?Ask your health care provider for help if you need support or information about quitting drugs. ?General instructions ?Schedule regular health, dental, and eye exams. ?Stay current with your vaccines. ?Tell your health care provider if: ?You often feel depressed. ?You have ever been abused or do not feel safe at home. ?Summary ?Adopting a healthy lifestyle and getting preventive care are important in promoting health and wellness. ?Follow your health care provider's instructions about healthy diet, exercising, and getting tested or screened for diseases. ?Follow your health care provider's instructions on monitoring your cholesterol and blood pressure. ?This information is not intended to replace advice given to you by your health care provider. Make sure you discuss any questions you have with your health care provider. ?Document Revised:  02/09/2021 Document Reviewed: 02/09/2021 ?Elsevier Patient Education ? [place]. ? ?

## 2022-01-25 NOTE — Assessment & Plan Note (Signed)
rec start vit D3 1000 IU daily.  ?

## 2022-01-25 NOTE — Assessment & Plan Note (Signed)
Preventative protocols reviewed and updated unless pt declined. Discussed healthy diet and lifestyle.  

## 2022-02-23 ENCOUNTER — Other Ambulatory Visit: Payer: Self-pay | Admitting: Family Medicine

## 2022-04-16 NOTE — Telephone Encounter (Signed)
error 

## 2022-05-06 LAB — TSH: TSH: 1.03 (ref 0.41–5.90)

## 2022-05-06 LAB — BASIC METABOLIC PANEL
Creatinine: 1.1 (ref 0.6–1.3)
Glucose: 92
Potassium: 4.4 mEq/L (ref 3.5–5.1)
Sodium: 140 (ref 137–147)

## 2022-05-06 LAB — CBC AND DIFFERENTIAL
Hemoglobin: 14.9 (ref 13.5–17.5)
Platelets: 217 10*3/uL (ref 150–400)
WBC: 5.1

## 2022-05-06 LAB — LIPID PANEL
Cholesterol: 109 (ref 0–200)
HDL: 41 (ref 35–70)
LDL Cholesterol: 49
Triglycerides: 98 (ref 40–160)

## 2022-05-06 LAB — HEPATIC FUNCTION PANEL
ALT: 17 U/L (ref 10–40)
AST: 20 (ref 14–40)
Alkaline Phosphatase: 66 (ref 25–125)

## 2022-05-06 LAB — COMPREHENSIVE METABOLIC PANEL: Albumin: 4.3 (ref 3.5–5.0)

## 2022-05-11 ENCOUNTER — Encounter: Payer: Self-pay | Admitting: Family Medicine

## 2022-05-12 NOTE — Telephone Encounter (Signed)
Printed results and placed in Dr. Synthia Innocent box.

## 2022-05-18 ENCOUNTER — Encounter: Payer: Self-pay | Admitting: Family Medicine

## 2023-01-03 ENCOUNTER — Other Ambulatory Visit: Payer: Self-pay

## 2023-01-03 ENCOUNTER — Telehealth: Payer: Self-pay | Admitting: Family Medicine

## 2023-01-03 MED ORDER — LEVOTHYROXINE SODIUM 100 MCG PO TABS: 100.0000 ug | ORAL_TABLET | Freq: Every day | ORAL | 3 refills | Status: DC

## 2023-01-03 NOTE — Telephone Encounter (Signed)
Prescription Request  01/03/2023  LOV: 01/25/2022  What is the name of the medication or equipment? levothyroxine (SYNTHROID) 100 MCG tablet  Have you contacted your pharmacy to request a refill? No   Which pharmacy would you like this sent to?   [id number] [name] [address] [place] [address] Phone: [phone] Fax: [phone]     Patient notified that their request is being sent to the clinical staff for review and that they should receive a response within 2 business days.   Please advise at Mobile [phone] (mobile)

## 2023-01-03 NOTE — Telephone Encounter (Signed)
Rx sent 

## 2023-03-05 ENCOUNTER — Other Ambulatory Visit: Payer: Self-pay | Admitting: Family Medicine

## 2023-03-05 DIAGNOSIS — E785 Hyperlipidemia, unspecified: Secondary | ICD-10-CM

## 2023-03-05 DIAGNOSIS — R7303 Prediabetes: Secondary | ICD-10-CM

## 2023-03-05 DIAGNOSIS — K7581 Nonalcoholic steatohepatitis (NASH): Secondary | ICD-10-CM

## 2023-03-05 DIAGNOSIS — E039 Hypothyroidism, unspecified: Secondary | ICD-10-CM

## 2023-03-05 DIAGNOSIS — E559 Vitamin D deficiency, unspecified: Secondary | ICD-10-CM

## 2023-03-07 ENCOUNTER — Other Ambulatory Visit (INDEPENDENT_AMBULATORY_CARE_PROVIDER_SITE_OTHER): Payer: 59

## 2023-03-07 DIAGNOSIS — K7581 Nonalcoholic steatohepatitis (NASH): Secondary | ICD-10-CM | POA: Diagnosis not present

## 2023-03-07 DIAGNOSIS — R7303 Prediabetes: Secondary | ICD-10-CM

## 2023-03-07 DIAGNOSIS — E785 Hyperlipidemia, unspecified: Secondary | ICD-10-CM

## 2023-03-07 DIAGNOSIS — E039 Hypothyroidism, unspecified: Secondary | ICD-10-CM | POA: Diagnosis not present

## 2023-03-07 DIAGNOSIS — E559 Vitamin D deficiency, unspecified: Secondary | ICD-10-CM

## 2023-03-07 LAB — CBC WITH DIFFERENTIAL/PLATELET
Basophils Absolute: 0.1 10*3/uL (ref 0.0–0.1)
Basophils Relative: 1 % (ref 0.0–3.0)
Eosinophils Absolute: 0.1 10*3/uL (ref 0.0–0.7)
Eosinophils Relative: 2.5 % (ref 0.0–5.0)
HCT: 43.8 % (ref 39.0–52.0)
Hemoglobin: 14.3 g/dL (ref 13.0–17.0)
Lymphocytes Relative: 23.3 % (ref 12.0–46.0)
Lymphs Abs: 1.2 10*3/uL (ref 0.7–4.0)
MCHC: 32.7 g/dL (ref 30.0–36.0)
MCV: 90.4 fl (ref 78.0–100.0)
Monocytes Absolute: 0.4 10*3/uL (ref 0.1–1.0)
Monocytes Relative: 7.8 % (ref 3.0–12.0)
Neutro Abs: 3.4 10*3/uL (ref 1.4–7.7)
Neutrophils Relative %: 65.4 % (ref 43.0–77.0)
Platelets: 196 10*3/uL (ref 150.0–400.0)
RBC: 4.85 Mil/uL (ref 4.22–5.81)
RDW: 12.5 % (ref 11.5–15.5)
WBC: 5.2 10*3/uL (ref 4.0–10.5)

## 2023-03-07 LAB — COMPREHENSIVE METABOLIC PANEL
ALT: 21 U/L (ref 0–53)
AST: 22 U/L (ref 0–37)
Albumin: 4.2 g/dL (ref 3.5–5.2)
Alkaline Phosphatase: 51 U/L (ref 39–117)
BUN: 18 mg/dL (ref 6–23)
CO2: 30 mEq/L (ref 19–32)
Calcium: 9 mg/dL (ref 8.4–10.5)
Chloride: 105 mEq/L (ref 96–112)
Creatinine, Ser: 1.06 mg/dL (ref 0.40–1.50)
GFR: 85.45 mL/min (ref >60.00)
Glucose, Bld: 91 mg/dL (ref 70–99)
Potassium: 4.5 mEq/L (ref 3.5–5.1)
Sodium: 139 mEq/L (ref 135–145)
Total Bilirubin: 0.6 mg/dL (ref 0.2–1.2)
Total Protein: 6.5 g/dL (ref 6.0–8.3)

## 2023-03-07 LAB — TSH: TSH: 0.43 u[IU]/mL (ref 0.35–5.50)

## 2023-03-07 LAB — HEMOGLOBIN A1C: Hgb A1c MFr Bld: 5.3 % (ref 4.6–6.5)

## 2023-03-07 LAB — LIPID PANEL
Cholesterol: 96 mg/dL (ref 0–200)
HDL: 37.2 mg/dL — ABNORMAL LOW (ref >39.00)
LDL Cholesterol: 40 mg/dL (ref 0–99)
NonHDL: 58.73
Total CHOL/HDL Ratio: 3
Triglycerides: 95 mg/dL (ref 0.0–149.0)
VLDL: 19 mg/dL (ref 0.0–40.0)

## 2023-03-07 LAB — IBC PANEL
Iron: 61 ug/dL (ref 42–165)
Saturation Ratios: 21.2 % (ref 20.0–50.0)
TIBC: 288.4 ug/dL (ref 250.0–450.0)
Transferrin: 206 mg/dL — ABNORMAL LOW (ref 212.0–360.0)

## 2023-03-07 LAB — VITAMIN D 25 HYDROXY (VIT D DEFICIENCY, FRACTURES): VITD: 33.13 ng/mL (ref 30.00–100.00)

## 2023-03-14 ENCOUNTER — Ambulatory Visit (INDEPENDENT_AMBULATORY_CARE_PROVIDER_SITE_OTHER): Payer: 59 | Admitting: Family Medicine

## 2023-03-14 ENCOUNTER — Encounter: Payer: Self-pay | Admitting: Family Medicine

## 2023-03-14 VITALS — BP 126/72 | HR 52 | Temp 97.4°F | Ht 70.0 in | Wt 227.0 lb

## 2023-03-14 DIAGNOSIS — E669 Obesity, unspecified: Secondary | ICD-10-CM

## 2023-03-14 DIAGNOSIS — E559 Vitamin D deficiency, unspecified: Secondary | ICD-10-CM

## 2023-03-14 DIAGNOSIS — E785 Hyperlipidemia, unspecified: Secondary | ICD-10-CM

## 2023-03-14 DIAGNOSIS — Z0000 Encounter for general adult medical examination without abnormal findings: Secondary | ICD-10-CM | POA: Diagnosis not present

## 2023-03-14 DIAGNOSIS — K7581 Nonalcoholic steatohepatitis (NASH): Secondary | ICD-10-CM

## 2023-03-14 DIAGNOSIS — R7303 Prediabetes: Secondary | ICD-10-CM | POA: Diagnosis not present

## 2023-03-14 DIAGNOSIS — E039 Hypothyroidism, unspecified: Secondary | ICD-10-CM

## 2023-03-14 NOTE — Assessment & Plan Note (Signed)
This has resolved with healthy diet and lifestyle changes and weight loss. The ASCVD Risk score (Arnett DK, et al., 2019) failed to calculate for the following reasons:   The valid total cholesterol range is 130 to 320 mg/dL

## 2023-03-14 NOTE — Assessment & Plan Note (Signed)
Chronic, stable on vit D 1000 IU daily.  

## 2023-03-14 NOTE — Assessment & Plan Note (Addendum)
Congratulated on weight loss to date. He is motivated to continue healthy diet and lifestyle choices to affect sustainable weight loss

## 2023-03-14 NOTE — Patient Instructions (Signed)
You are doing well today Continue healthy diet and lifestyle choices.  Return in 1 year for next physical.

## 2023-03-14 NOTE — Assessment & Plan Note (Signed)
LFTs remain normal.

## 2023-03-14 NOTE — Assessment & Plan Note (Signed)
Will resolve from problem - with weight loss.

## 2023-03-14 NOTE — Assessment & Plan Note (Signed)
Chronic, stable on current regimen.  

## 2023-03-14 NOTE — Assessment & Plan Note (Signed)
Preventative protocols reviewed and updated unless pt declined. Discussed healthy diet and lifestyle.  

## 2023-03-14 NOTE — Progress Notes (Signed)
Ph: [phone] Fax: [phone]   Patient ID: Antonio Doyle, male    DOB: Mar 11, 1981, 42 y.o.   MRN: [id number]  This visit was conducted in person.  BP 126/72   Pulse (!) 52   Temp (!) 97.4 F (36.3 C) (Temporal)   Ht 5\' 10"  (1.778 m)   Wt 227 lb (103 kg)   SpO2 99%   BMI 32.57 kg/m   BP Readings from Last 3 Encounters:  03/14/23 126/72  01/25/22 126/70  11/25/20 124/70    Pulse Readings from Last 3 Encounters:  03/14/23 (!) 52  01/25/22 (!) 57  11/25/20 75    CC: CPE Subjective:   HPI: Antonio Doyle is a 42 y.o. male presenting on 03/14/2023 for Annual Exam   10 lb weight loss since last year. Exercising daily, weight training 3d/wk, running 4d/wk.   Preventative: Colon cancer screening - start next year  Flu shot - allergic to eggs - has declined flu blok  COVID vaccine Pfizer 01/2020, 02/2020, booster 02/2021 Td ~2010. Tdap 09/2019. Seat belt use discussed.  Sunscreen use discussed. No changing moles on skin.  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 Interim medical history since our last visit reviewed. Allergies and medications reviewed and updated. Outpatient Medications Prior to Visit  Medication Sig Dispense Refill   Cholecalciferol (VITAMIN D3) 25 MCG (1000 UT) CAPS Take 1 capsule (1,000 Units total) by mouth daily. 30 capsule    levothyroxine (SYNTHROID) 100 MCG tablet Take 1 tablet (100 mcg total) by mouth daily. 90 tablet 3   No facility-administered medications prior to visit.     Per HPI unless specifically indicated in ROS section  below Review of Systems  Constitutional:  Negative for activity change, appetite change, chills, fatigue, fever and unexpected weight change.  HENT:  Negative for hearing loss.   Eyes:  Negative for visual disturbance.  Respiratory:  Negative for cough, chest tightness, shortness of breath and wheezing.   Cardiovascular:  Negative for chest pain, palpitations and leg swelling.  Gastrointestinal:  Negative for abdominal distention, abdominal pain, blood in stool, constipation, diarrhea, nausea and vomiting.  Genitourinary:  Negative for difficulty urinating and hematuria.  Musculoskeletal:  Negative for arthralgias, myalgias and neck pain.  Skin:  Negative for rash.  Neurological:  Negative for dizziness, seizures, syncope and headaches.  Hematological:  Negative for adenopathy. Does not bruise/bleed easily.  Psychiatric/Behavioral:  Negative for dysphoric mood. The patient is not nervous/anxious.     Objective:  BP 126/72   Pulse (!) 52   Temp (!) 97.4 F (36.3 C) (Temporal)   Ht 5\' 10"  (1.778 m)   Wt 227 lb (103 kg)   SpO2 99%   BMI 32.57 kg/m   Wt Readings from Last 3 Encounters:  03/14/23 227 lb (103 kg)  01/25/22 237 lb (107.5 kg)  11/25/20 244 lb 2 oz (110.7 kg)      Physical Exam Vitals and nursing note reviewed.  Constitutional:      General: He is not in acute distress.    Appearance: Normal appearance. He is  well-developed. He is not ill-appearing.  HENT:     Head: Normocephalic and atraumatic.     Right Ear: Hearing, tympanic membrane, ear canal and external ear normal.     Left Ear: Hearing, tympanic membrane, ear canal and external ear normal.     Mouth/Throat:     Mouth: Mucous membranes are moist.     Pharynx: Oropharynx is clear. No oropharyngeal exudate or posterior oropharyngeal erythema.  Eyes:     General: No scleral icterus.    Extraocular Movements: Extraocular movements intact.     Conjunctiva/sclera: Conjunctivae normal.     Pupils: Pupils are  equal, round, and reactive to light.  Neck:     Thyroid: No thyroid mass or thyromegaly.  Cardiovascular:     Rate and Rhythm: Regular rhythm. Bradycardia present.     Pulses: Normal pulses.          Radial pulses are 2+ on the right side and 2+ on the left side.     Heart sounds: Normal heart sounds. No murmur heard. Pulmonary:     Effort: Pulmonary effort is normal. No respiratory distress.     Breath sounds: Normal breath sounds. No wheezing, rhonchi or rales.  Abdominal:     General: Bowel sounds are normal. There is no distension.     Palpations: Abdomen is soft. There is no mass.     Tenderness: There is no abdominal tenderness. There is no guarding or rebound.     Hernia: No hernia is present.  Musculoskeletal:        General: Normal range of motion.     Cervical back: Normal range of motion and neck supple.     Right lower leg: No edema.     Left lower leg: No edema.  Lymphadenopathy:     Cervical: No cervical adenopathy.  Skin:    General: Skin is warm and dry.     Findings: No rash.  Neurological:     General: No focal deficit present.     Mental Status: He is alert and oriented to person, place, and time.  Psychiatric:        Mood and Affect: Mood normal.        Behavior: Behavior normal.        Thought Content: Thought content normal.        Judgment: Judgment normal.       Results for orders placed or performed in visit on 03/07/23  IBC panel  Result Value Ref Range   Iron 61 42 - 165 ug/dL   Transferrin 161.0 (L) 212.0 - 360.0 mg/dL   Saturation Ratios 96.0 20.0 - 50.0 %   TIBC 288.4 250.0 - 450.0 mcg/dL  CBC with Differential/Platelet  Result Value Ref Range   WBC 5.2 4.0 - 10.5 K/uL   RBC 4.85 4.22 - 5.81 Mil/uL   Hemoglobin 14.3 13.0 - 17.0 g/dL   HCT 45.4 09.8 - 11.9 %   MCV 90.4 78.0 - 100.0 fl   MCHC 32.7 30.0 - 36.0 g/dL   RDW 14.7 82.9 - 56.2 %   Platelets 196.0 150.0 - 400.0 K/uL   Neutrophils Relative % 65.4 43.0 - 77.0 %   Lymphocytes  Relative 23.3 12.0 - 46.0 %   Monocytes Relative 7.8 3.0 - 12.0 %   Eosinophils Relative 2.5 0.0 - 5.0 %   Basophils Relative 1.0 0.0 - 3.0 %   Neutro Abs 3.4 1.4 - 7.7 K/uL   Lymphs Abs 1.2 0.7 - 4.0  K/uL   Monocytes Absolute 0.4 0.1 - 1.0 K/uL   Eosinophils Absolute 0.1 0.0 - 0.7 K/uL   Basophils Absolute 0.1 0.0 - 0.1 K/uL  VITAMIN D 25 Hydroxy (Vit-D Deficiency, Fractures)  Result Value Ref Range   VITD 33.13 30.00 - 100.00 ng/mL  Hemoglobin A1c  Result Value Ref Range   Hgb A1c MFr Bld 5.3 4.6 - 6.5 %  TSH  Result Value Ref Range   TSH 0.43 0.35 - 5.50 uIU/mL  Comprehensive metabolic panel  Result Value Ref Range   Sodium 139 135 - 145 mEq/L   Potassium 4.5 3.5 - 5.1 mEq/L   Chloride 105 96 - 112 mEq/L   CO2 30 19 - 32 mEq/L   Glucose, Bld 91 70 - 99 mg/dL   BUN 18 6 - 23 mg/dL   Creatinine, Ser 1.61 0.40 - 1.50 mg/dL   Total Bilirubin 0.6 0.2 - 1.2 mg/dL   Alkaline Phosphatase 51 39 - 117 U/L   AST 22 0 - 37 U/L   ALT 21 0 - 53 U/L   Total Protein 6.5 6.0 - 8.3 g/dL   Albumin 4.2 3.5 - 5.2 g/dL   GFR 09.60 >45.40 mL/min   Calcium 9.0 8.4 - 10.5 mg/dL  Lipid panel  Result Value Ref Range   Cholesterol 96 0 - 200 mg/dL   Triglycerides 98.1 0.0 - 149.0 mg/dL   HDL 19.14 (L) >78.29 mg/dL   VLDL 56.2 0.0 - 13.0 mg/dL   LDL Cholesterol 40 0 - 99 mg/dL   Total CHOL/HDL Ratio 3    NonHDL 58.73     Assessment & Plan:   Problem List Items Addressed This Visit     Healthcare maintenance - Primary (Chronic)    Preventative protocols reviewed and updated unless pt declined. Discussed healthy diet and lifestyle.       Hypothyroidism    Chronic, stable on current regimen.       Dyslipidemia    This has resolved with healthy diet and lifestyle changes and weight loss. The ASCVD Risk score (Arnett DK, et al., 2019) failed to calculate for the following reasons:   The valid total cholesterol range is 130 to 320 mg/dL       RESOLVED: Prediabetes    Will resolve  from problem - with weight loss.       Obesity, Class I, BMI 30-34.9    Congratulated on weight loss to date. He is motivated to continue healthy diet and lifestyle choices to affect sustainable weight loss      NASH (nonalcoholic steatohepatitis)    LFTs remain normal.       Vitamin D deficiency    Chronic, stable on vit D 1000 IU daily.         No orders of the defined types were placed in this encounter.   No orders of the defined types were placed in this encounter.   Patient Instructions  You are doing well today Continue healthy diet and lifestyle choices.  Return in 1 year for next physical.   Follow up plan: Return in about 1 year (around 03/13/2024) for annual exam, prior fasting for blood work.  Eustaquio Boyden, MD

## 2024-01-12 ENCOUNTER — Other Ambulatory Visit: Payer: Self-pay | Admitting: Family Medicine

## 2024-01-12 DIAGNOSIS — E039 Hypothyroidism, unspecified: Secondary | ICD-10-CM

## 2024-03-14 ENCOUNTER — Ambulatory Visit (INDEPENDENT_AMBULATORY_CARE_PROVIDER_SITE_OTHER): Admitting: Family Medicine

## 2024-03-14 VITALS — BP 124/76 | HR 52 | Temp 98.3°F | Ht 70.0 in | Wt 230.0 lb

## 2024-03-14 DIAGNOSIS — Z0000 Encounter for general adult medical examination without abnormal findings: Secondary | ICD-10-CM | POA: Diagnosis not present

## 2024-03-14 DIAGNOSIS — E559 Vitamin D deficiency, unspecified: Secondary | ICD-10-CM | POA: Diagnosis not present

## 2024-03-14 DIAGNOSIS — E785 Hyperlipidemia, unspecified: Secondary | ICD-10-CM | POA: Diagnosis not present

## 2024-03-14 DIAGNOSIS — K7581 Nonalcoholic steatohepatitis (NASH): Secondary | ICD-10-CM

## 2024-03-14 DIAGNOSIS — E039 Hypothyroidism, unspecified: Secondary | ICD-10-CM | POA: Diagnosis not present

## 2024-03-14 DIAGNOSIS — R5383 Other fatigue: Secondary | ICD-10-CM | POA: Insufficient documentation

## 2024-03-14 DIAGNOSIS — Z1211 Encounter for screening for malignant neoplasm of colon: Secondary | ICD-10-CM

## 2024-03-14 DIAGNOSIS — R5382 Chronic fatigue, unspecified: Secondary | ICD-10-CM

## 2024-03-14 DIAGNOSIS — E66811 Obesity, class 1: Secondary | ICD-10-CM

## 2024-03-14 MED ORDER — LEVOTHYROXINE SODIUM 100 MCG PO TABS: 100.0000 ug | ORAL_TABLET | Freq: Every day | ORAL | 4 refills | Status: DC

## 2024-03-14 NOTE — Assessment & Plan Note (Signed)
 Chronic, stable on vit D 1000 units daily - update levels.

## 2024-03-14 NOTE — Assessment & Plan Note (Signed)
Update LFTs today.  

## 2024-03-14 NOTE — Assessment & Plan Note (Signed)
 Preventative protocols reviewed and updated unless pt declined. Discussed healthy diet and lifestyle.

## 2024-03-14 NOTE — Assessment & Plan Note (Addendum)
 Chronic, continues levothyroxine  100mcg daily - update TSH.

## 2024-03-14 NOTE — Assessment & Plan Note (Signed)
 Continue to encourage healthy diet and lifestyle choices to affect sustainable weight loss.

## 2024-03-14 NOTE — Assessment & Plan Note (Signed)
 Chronic off med, update FLP today The ASCVD Risk score (Arnett DK, et al., 2019) failed to calculate for the following reasons:   The valid total cholesterol range is 130 to 320 mg/dL

## 2024-03-14 NOTE — Progress Notes (Signed)
 Ph: [phone] Fax: [phone]   Patient ID: Antonio Doyle, male    DOB: Jan 27, 1981, 43 y.o.   MRN: [id number]  This visit was conducted in person.  BP 124/76   Pulse (!) 52   Temp 98.3 F (36.8 C) (Oral)   Ht 5' 10 (1.778 m)   Wt 230 lb (104.3 kg)   SpO2 97%   BMI 33.00 kg/m   Pulse Readings from Last 3 Encounters:  03/14/24 (!) 52  03/14/23 (!) 52  01/25/22 (!) 57   CC: CPE Subjective:   HPI: Antonio Doyle is a 43 y.o. male presenting on 03/14/2024 for Annual Exam   Ongoing fatigue for 9 months.  Notes increased fatigue, easy naps during the day.  Some trouble falling asleep at night. Overall restorative sleep.  No snoring, witnessed apneas, PNDyspnea, morning headache.  Notes depressed mood worse in the fall with longer days. Has started feeling better with spring/summer weather.  Sex drive ok.   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  COVID vaccine Pfizer 01/2020, 02/2020, booster 02/2021 Td ~2010. Tdap 09/2019. Seat belt use discussed.  Sunscreen use discussed. No changing moles on skin.  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 Interim medical history since our last visit reviewed. Allergies and medications reviewed and updated. Outpatient Medications Prior to Visit  Medication Sig Dispense Refill   Cholecalciferol (VITAMIN D3) 25 MCG (1000 UT) CAPS Take 1 capsule (1,000 Units total) by  mouth daily. 30 capsule    levothyroxine  (SYNTHROID ) 100 MCG tablet TAKE 1 TABLET BY MOUTH EVERY DAY 90 tablet 0   No facility-administered medications prior to visit.     Per HPI unless specifically indicated in ROS section below Review of Systems  Constitutional:  Negative for activity change, appetite change, chills, fatigue, fever and unexpected weight change.  HENT:  Negative for hearing loss.   Eyes:  Negative for visual disturbance.  Respiratory:  Negative for cough, chest tightness, shortness of breath and wheezing.   Cardiovascular:  Negative for chest pain, palpitations and leg swelling.  Gastrointestinal:  Negative for abdominal distention, abdominal pain, blood in stool, constipation, diarrhea, nausea and vomiting.  Genitourinary:  Negative for difficulty urinating and hematuria.  Musculoskeletal:  Negative for arthralgias, myalgias and neck pain.  Skin:  Negative for rash.  Neurological:  Negative for dizziness, seizures, syncope and headaches.  Hematological:  Negative for adenopathy. Does not bruise/bleed easily.  Psychiatric/Behavioral:  Positive for dysphoric mood. The patient is not nervous/anxious.     Objective:  BP 124/76   Pulse (!) 52   Temp 98.3 F (36.8 C) (Oral)   Ht 5' 10 (1.778 m)   Wt 230 lb (104.3 kg)   SpO2 97%   BMI 33.00 kg/m   Wt Readings from Last 3 Encounters:  03/14/24 230 lb (104.3 kg)  03/14/23 227 lb (103 kg)  01/25/22 237 lb (107.5 kg)  Physical Exam Vitals and nursing note reviewed.  Constitutional:      General: He is not in acute distress.    Appearance: Normal appearance. He is well-developed. He is not ill-appearing.  HENT:     Head: Normocephalic and atraumatic.     Right Ear: Hearing, tympanic membrane, ear canal and external ear normal.     Left Ear: Hearing, tympanic membrane, ear canal and external ear normal.     Nose: Nose normal.     Mouth/Throat:     Mouth: Mucous membranes are moist.     Pharynx: Oropharynx  is clear. No oropharyngeal exudate or posterior oropharyngeal erythema.  Eyes:     General: No scleral icterus.    Extraocular Movements: Extraocular movements intact.     Conjunctiva/sclera: Conjunctivae normal.     Pupils: Pupils are equal, round, and reactive to light.  Neck:     Thyroid : No thyroid  mass or thyromegaly.  Cardiovascular:     Rate and Rhythm: Normal rate and regular rhythm.     Pulses: Normal pulses.          Radial pulses are 2+ on the right side and 2+ on the left side.     Heart sounds: Normal heart sounds. No murmur heard. Pulmonary:     Effort: Pulmonary effort is normal. No respiratory distress.     Breath sounds: Normal breath sounds. No wheezing, rhonchi or rales.  Abdominal:     General: Bowel sounds are normal. There is no distension.     Palpations: Abdomen is soft. There is no mass.     Tenderness: There is no abdominal tenderness. There is no guarding or rebound.     Hernia: No hernia is present.  Musculoskeletal:        General: Normal range of motion.     Cervical back: Normal range of motion and neck supple.     Right lower leg: No edema.     Left lower leg: No edema.  Lymphadenopathy:     Cervical: No cervical adenopathy.  Skin:    General: Skin is warm and dry.     Findings: No rash.  Neurological:     General: No focal deficit present.     Mental Status: He is alert and oriented to person, place, and time.  Psychiatric:        Mood and Affect: Mood normal.        Behavior: Behavior normal.        Thought Content: Thought content normal.        Judgment: Judgment normal.       Results for orders placed or performed in visit on 03/07/23  IBC panel   Collection Time: 03/07/23  8:59 AM  Result Value Ref Range   Iron 61 42 - 165 ug/dL   Transferrin 161.0 (L) 212.0 - 360.0 mg/dL   Saturation Ratios 96.0 20.0 - 50.0 %   TIBC 288.4 250.0 - 450.0 mcg/dL  CBC with Differential/Platelet   Collection Time: 03/07/23  8:59 AM  Result Value  Ref Range   WBC 5.2 4.0 - 10.5 K/uL   RBC 4.85 4.22 - 5.81 Mil/uL   Hemoglobin 14.3 13.0 - 17.0 g/dL   HCT 45.4 09.8 - 11.9 %   MCV 90.4 78.0 - 100.0 fl   MCHC 32.7 30.0 - 36.0 g/dL   RDW 14.7 82.9 - 56.2 %   Platelets 196.0 150.0 - 400.0 K/uL   Neutrophils Relative % 65.4 43.0 - 77.0 %  Lymphocytes Relative 23.3 12.0 - 46.0 %   Monocytes Relative 7.8 3.0 - 12.0 %   Eosinophils Relative 2.5 0.0 - 5.0 %   Basophils Relative 1.0 0.0 - 3.0 %   Neutro Abs 3.4 1.4 - 7.7 K/uL   Lymphs Abs 1.2 0.7 - 4.0 K/uL   Monocytes Absolute 0.4 0.1 - 1.0 K/uL   Eosinophils Absolute 0.1 0.0 - 0.7 K/uL   Basophils Absolute 0.1 0.0 - 0.1 K/uL  VITAMIN D  25 Hydroxy (Vit-D Deficiency, Fractures)   Collection Time: 03/07/23  8:59 AM  Result Value Ref Range   VITD 33.13 30.00 - 100.00 ng/mL  Hemoglobin A1c   Collection Time: 03/07/23  8:59 AM  Result Value Ref Range   Hgb A1c MFr Bld 5.3 4.6 - 6.5 %  TSH   Collection Time: 03/07/23  8:59 AM  Result Value Ref Range   TSH 0.43 0.35 - 5.50 uIU/mL  Comprehensive metabolic panel   Collection Time: 03/07/23  8:59 AM  Result Value Ref Range   Sodium 139 135 - 145 mEq/L   Potassium 4.5 3.5 - 5.1 mEq/L   Chloride 105 96 - 112 mEq/L   CO2 30 19 - 32 mEq/L   Glucose, Bld 91 70 - 99 mg/dL   BUN 18 6 - 23 mg/dL   Creatinine, Ser 4.09 0.40 - 1.50 mg/dL   Total Bilirubin 0.6 0.2 - 1.2 mg/dL   Alkaline Phosphatase 51 39 - 117 U/L   AST 22 0 - 37 U/L   ALT 21 0 - 53 U/L   Total Protein 6.5 6.0 - 8.3 g/dL   Albumin 4.2 3.5 - 5.2 g/dL   GFR 81.19 >14.78 mL/min   Calcium 9.0 8.4 - 10.5 mg/dL  Lipid panel   Collection Time: 03/07/23  8:59 AM  Result Value Ref Range   Cholesterol 96 0 - 200 mg/dL   Triglycerides 29.5 0.0 - 149.0 mg/dL   HDL 62.13 (L) >08.65 mg/dL   VLDL 78.4 0.0 - 69.6 mg/dL   LDL Cholesterol 40 0 - 99 mg/dL   Total CHOL/HDL Ratio 3    NonHDL 58.73       03/14/2024    4:08 PM 03/14/2023    4:07 PM 01/25/2022    1:58 PM 11/25/2020    10:32 AM 09/18/2019    3:09 PM  Depression screen PHQ 2/9  Decreased Interest 0 0 0 0 0  Down, Depressed, Hopeless 1 0 0 0 0  PHQ - 2 Score 1 0 0 0 0  Altered sleeping 1 0     Tired, decreased energy 1 1     Change in appetite 0 0     Feeling bad or failure about yourself  0 0     Trouble concentrating 0 0     Moving slowly or fidgety/restless 0 0     Suicidal thoughts 0 0     PHQ-9 Score 3 1     Difficult doing work/chores Not difficult at all Not difficult at all          03/14/2024    4:09 PM 03/14/2023    4:07 PM  GAD 7 : Generalized Anxiety Score  Nervous, Anxious, on Edge 0 0  Control/stop worrying 0 0  Worry too much - different things 0 0  Trouble relaxing 0 0  Restless 0 0  Easily annoyed or irritable 0 0  Afraid - awful might happen 0 0  Total GAD 7 Score 0 0  Assessment & Plan:   Problem List Items Addressed This Visit     Healthcare maintenance - Primary (Chronic)   Preventative protocols reviewed and updated unless pt declined. Discussed healthy diet and lifestyle.       Hypothyroidism   Chronic, continues levothyroxine  100mcg daily - update TSH.       Relevant Medications   levothyroxine  (SYNTHROID ) 100 MCG tablet   Other Relevant Orders   TSH   Dyslipidemia   Chronic off med, update FLP today The ASCVD Risk score (Arnett DK, et al., 2019) failed to calculate for the following reasons:   The valid total cholesterol range is 130 to 320 mg/dL       Relevant Orders   Lipid panel   Comprehensive metabolic panel with GFR   Obesity, Class I, BMI 30-34.9   Continue to encourage healthy diet and lifestyle choices to affect sustainable weight loss       NASH (nonalcoholic steatohepatitis)   Update LFTs today       Relevant Orders   Comprehensive metabolic panel with GFR   CBC with Differential/Platelet   Vitamin D  deficiency   Chronic, stable on vit D 1000 units daily - update levels.       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 Encouraged healthy stress relieving strategies.  ESS = 7 and not describing symptoms typical of sleep apnea Check for reversible causes of fatigue with labwork today.  If normal, consider T evaluation at early am lab.      Relevant Orders   Vitamin B12   Other Visit Diagnoses       Special screening for malignant neoplasms, colon       Relevant Orders   Ambulatory referral to Gastroenterology        Meds ordered this encounter  Medications   levothyroxine  (SYNTHROID ) 100 MCG tablet    Sig: Take 1 tablet (100 mcg total) by mouth daily.    Dispense:  90 tablet    Refill:  4    Orders Placed This Encounter  Procedures   Lipid panel   Comprehensive metabolic panel with GFR   TSH   CBC with Differential/Platelet   Vitamin B12   VITAMIN D  25 Hydroxy (Vit-D Deficiency, Fractures)   Ambulatory referral to Gastroenterology    Referral Priority:   Routine    Referral Type:   Consultation    Referral Reason:   Specialty Services Required    Number of Visits Requested:   1    Patient Instructions  Labs today  Let me know if ongoing symptoms to consider early morning testosterone check.  You may call [facility] to discuss colonoscopy at [phone].   Good to see you today Return as needed or in 1 year for next physical   Follow up plan: Return in about 1 year (around 03/14/2025) for annual exam, prior fasting for blood work.  Claire Crick, MD

## 2024-03-14 NOTE — Patient Instructions (Addendum)
 Labs today  Let me know if ongoing symptoms to consider early morning testosterone check.  You may call [facility] to discuss colonoscopy at [phone].   Good to see you today Return as needed or in 1 year for next physical

## 2024-03-14 NOTE — Assessment & Plan Note (Addendum)
 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 Encouraged healthy stress relieving strategies.  ESS = 7 and not describing symptoms typical of sleep apnea Check for reversible causes of fatigue with labwork today.  If normal, consider T evaluation at early am lab.

## 2024-03-15 ENCOUNTER — Ambulatory Visit: Payer: Self-pay | Admitting: Family Medicine

## 2024-03-15 LAB — COMPREHENSIVE METABOLIC PANEL WITH GFR
AG Ratio: 1.8 (calc) (ref 1.0–2.5)
ALT: 20 U/L (ref 9–46)
AST: 27 U/L (ref 10–40)
Albumin: 4.5 g/dL (ref 3.6–5.1)
Alkaline phosphatase (APISO): 58 U/L (ref 36–130)
BUN: 14 mg/dL (ref 7–25)
CO2: 27 mmol/L (ref 20–32)
Calcium: 9.6 mg/dL (ref 8.6–10.3)
Chloride: 103 mmol/L (ref 98–110)
Creat: 1.09 mg/dL (ref 0.60–1.29)
Globulin: 2.5 g/dL (ref 1.9–3.7)
Glucose, Bld: 91 mg/dL (ref 65–99)
Potassium: 4.2 mmol/L (ref 3.5–5.3)
Sodium: 141 mmol/L (ref 135–146)
Total Bilirubin: 0.5 mg/dL (ref 0.2–1.2)
Total Protein: 7 g/dL (ref 6.1–8.1)
eGFR: 85 mL/min/{1.73_m2} (ref >=60)

## 2024-03-15 LAB — LIPID PANEL
Cholesterol: 130 mg/dL (ref <200)
HDL: 44 mg/dL (ref >=40)
LDL Cholesterol (Calc): 60 mg/dL
Non-HDL Cholesterol (Calc): 86 mg/dL (ref <130)
Total CHOL/HDL Ratio: 3 (calc) (ref <5.0)
Triglycerides: 190 mg/dL — ABNORMAL HIGH (ref <150)

## 2024-03-15 LAB — CBC WITH DIFFERENTIAL/PLATELET
Absolute Lymphocytes: 1518 {cells}/uL (ref 850–3900)
Absolute Monocytes: 462 {cells}/uL (ref 200–950)
Basophils Absolute: 48 {cells}/uL (ref 0–200)
Basophils Relative: 0.8 %
Eosinophils Absolute: 228 {cells}/uL (ref 15–500)
Eosinophils Relative: 3.8 %
HCT: 46 % (ref 38.5–50.0)
Hemoglobin: 14.7 g/dL (ref 13.2–17.1)
MCH: 29.3 pg (ref 27.0–33.0)
MCHC: 32 g/dL (ref 32.0–36.0)
MCV: 91.8 fL (ref 80.0–100.0)
MPV: 11.9 fL (ref 7.5–12.5)
Monocytes Relative: 7.7 %
Neutro Abs: 3744 {cells}/uL (ref 1500–7800)
Neutrophils Relative %: 62.4 %
Platelets: 239 10*3/uL (ref 140–400)
RBC: 5.01 10*6/uL (ref 4.20–5.80)
RDW: 11.9 % (ref 11.0–15.0)
Total Lymphocyte: 25.3 %
WBC: 6 10*3/uL (ref 3.8–10.8)

## 2024-03-15 LAB — VITAMIN B12: Vitamin B-12: 361 pg/mL (ref 200–1100)

## 2024-03-15 LAB — TSH: TSH: 0.79 m[IU]/L (ref 0.40–4.50)

## 2024-03-15 LAB — VITAMIN D 25 HYDROXY (VIT D DEFICIENCY, FRACTURES): Vit D, 25-Hydroxy: 46 ng/mL (ref 30–100)

## 2024-04-20 ENCOUNTER — Encounter: Payer: Self-pay | Admitting: Internal Medicine

## 2024-05-14 ENCOUNTER — Ambulatory Visit (AMBULATORY_SURGERY_CENTER)

## 2024-05-14 ENCOUNTER — Encounter: Payer: Self-pay | Admitting: Internal Medicine

## 2024-05-14 VITALS — Ht 70.0 in | Wt 230.0 lb

## 2024-05-14 DIAGNOSIS — Z1211 Encounter for screening for malignant neoplasm of colon: Secondary | ICD-10-CM

## 2024-05-14 MED ORDER — NA SULFATE-K SULFATE-MG SULF 17.5-3.13-1.6 GM/177ML PO SOLN: 1.0000 | Freq: Once | ORAL | 0 refills | Status: AC

## 2024-05-14 NOTE — Progress Notes (Signed)
 No egg or soy allergy known to patient  No issues known to pt with past sedation with any surgeries or procedures Patient denies ever being told they had issues or difficulty with intubation  No FH of Malignant Hyperthermia Pt is not on diet pills Pt is not on  home 02  Pt is not on blood thinners  Pt denies issues with constipation  No A fib or A flutter Have any cardiac testing pending--No  LOA: independent  Prep: suprep   Patient's chart reviewed by Norleen Schillings CNRA prior to previsit and patient appropriate for the [facility].  Previsit completed and red dot placed by patient's name on their procedure day (on provider's schedule).     PV completed with patient. Prep instructions sent via mychart and home address.

## 2024-06-18 ENCOUNTER — Encounter: Payer: Self-pay | Admitting: Internal Medicine

## 2024-06-18 ENCOUNTER — Ambulatory Visit: Admitting: Internal Medicine

## 2024-06-18 VITALS — BP 116/56 | HR 54 | Temp 97.8°F | Resp 23 | Ht 70.0 in | Wt 230.0 lb

## 2024-06-18 DIAGNOSIS — K635 Polyp of colon: Secondary | ICD-10-CM | POA: Diagnosis not present

## 2024-06-18 DIAGNOSIS — D123 Benign neoplasm of transverse colon: Secondary | ICD-10-CM

## 2024-06-18 DIAGNOSIS — K5730 Diverticulosis of large intestine without perforation or abscess without bleeding: Secondary | ICD-10-CM | POA: Diagnosis not present

## 2024-06-18 DIAGNOSIS — Z1211 Encounter for screening for malignant neoplasm of colon: Secondary | ICD-10-CM

## 2024-06-18 MED ORDER — SODIUM CHLORIDE 0.9 % IV SOLN: 500.0000 mL | INTRAVENOUS | Status: DC

## 2024-06-18 NOTE — Progress Notes (Signed)
 Called to room to assist during endoscopic procedure.  Patient ID and intended procedure confirmed with present staff. Received instructions for my participation in the procedure from the performing physician.

## 2024-06-18 NOTE — Patient Instructions (Addendum)

## 2024-06-18 NOTE — Progress Notes (Signed)
 Pt's states no medical or surgical changes since previsit or office visit.

## 2024-06-18 NOTE — Progress Notes (Signed)
 GASTROENTEROLOGY PROCEDURE H&P NOTE   Primary Care Physician: Rilla Baller, MD    Reason for Procedure:   Colon cancer screening  Plan:    colonoscopy  Patient is appropriate for endoscopic procedure(s) in the ambulatory ([facility]) setting.  The nature of the procedure, as well as the risks, benefits, and alternatives were carefully and thoroughly reviewed with the patient. Ample time for discussion and questions allowed. The patient understood, was satisfied, and agreed to proceed.     HPI: Antonio Doyle is a 43 y.o. male who presents for colonoscopy.  Medical history as below.  Tolerated the prep.  No recent chest pain or shortness of breath.  No abdominal pain today.  Past Medical History:  Diagnosis Date   Allergy    Dyslipidemia    GERD (gastroesophageal reflux disease)    Hypothyroidism    NAFLD (nonalcoholic fatty liver disease) 98/98/7985   likely NASH - diffuse with hepatomegaly and borderline splenomegaly s/p GI w/u (MRI 2014, [facility])   Obesity     Past Surgical History:  Procedure Laterality Date   CYST EXCISION  05/2021   Excision of posterior neck epidermal inclusion cyst at [facility]   WISDOM TOOTH EXTRACTION     x 3    Prior to Admission medications   Medication Sig Start Date End Date Taking? Authorizing Provider  Cholecalciferol (VITAMIN D3) 25 MCG (1000 UT) CAPS Take 1 capsule (1,000 Units total) by mouth daily. 01/25/22  Yes Rilla Baller, MD  levothyroxine  (SYNTHROID ) 100 MCG tablet Take 1 tablet (100 mcg total) by mouth daily. 03/14/24  Yes Rilla Baller, MD    Current Outpatient Medications  Medication Sig Dispense Refill   Cholecalciferol (VITAMIN D3) 25 MCG (1000 UT) CAPS Take 1 capsule (1,000 Units total) by mouth daily. 30 capsule    levothyroxine  (SYNTHROID ) 100 MCG tablet Take 1 tablet (100 mcg total) by mouth daily. 90 tablet 4   Current Facility-Administered Medications  Medication Dose Route Frequency Provider Last Rate Last  Admin   0.9 %  sodium chloride  infusion  500 mL Intravenous Continuous Kiah Keay, Gordy HERO, MD        Allergies as of 06/18/2024 - Review Complete 06/18/2024  Allergen Reaction Noted   Egg-derived products Anaphylaxis 02/07/2008    Family History  Adopted: Yes  Problem Relation Age of Onset   Heart failure Mother    Stroke Father 64       smoker   Pulmonary embolism Father    Colon polyps Father    Cancer Maternal Grandfather 35       pancreas   Colon cancer Paternal Grandfather    Cancer Paternal Grandfather 85       colon   Diabetes Neg Hx    Hypertension Neg Hx    CAD Neg Hx    Rectal cancer Neg Hx    Stomach cancer Neg Hx     Social History   Socioeconomic History   Marital status: Married    Spouse name: Not on file   Number of children: 2   Years of education: Not on file   Highest education level: Bachelor's degree (e.g., BA, AB, BS)  Occupational History   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    Employer: [employer]  Tobacco Use   Smoking status: Never   Smokeless tobacco: Never  Vaping Use   Vaping status: Never Used  Substance and Sexual Activity   Alcohol use: No   Drug use: No   Sexual activity: Not on  file  Other Topics Concern   Not on file  Social History Narrative   Caffeine: 1 mt dew daily   Lives with wife and 2 daughters, 2 dogs    Occupation: [employer]   Edu: college    Activity: working out at work gym regularly   Diet: good water, some mt dew daily, fruits/vegetables daily    Social Drivers of Corporate investment banker Strain: Low Risk  (03/14/2024)   Overall Financial Resource Strain (CARDIA)    Difficulty of Paying Living Expenses: Not very hard  Food Insecurity: No Food Insecurity (03/14/2024)   Hunger Vital Sign    Worried About Running Out of Food in the Last Year: Never true    Ran Out of Food in the Last Year: Never true  Transportation Needs: No Transportation Needs (03/14/2024)   PRAPARE - Doctor, general practice (Medical): No    Lack of Transportation (Non-Medical): No  Physical Activity: Sufficiently Active (03/14/2024)   Exercise Vital Sign    Days of Exercise per Week: 7 days    Minutes of Exercise per Session: 60 min  Stress: No Stress Concern Present (03/14/2024)   [employer] of Occupational Health - Occupational Stress Questionnaire    Feeling of Stress : Only a little  Social Connections: Socially Integrated (03/14/2024)   Social Connection and Isolation Panel    Frequency of Communication with Friends and Family: More than three times a week    Frequency of Social Gatherings with Friends and Family: Once a week    Attends Religious Services: More than 4 times per year    Active Member of [employer] or Organizations: Yes    Attends Engineer, structural: More than 4 times per year    Marital Status: Married  Catering manager Violence: Not on file    Physical Exam: Vital signs in last 24 hours: @BP  (!) 143/76   Pulse (!) 46   Temp 97.8 F (36.6 C) (Temporal)   Ht 5' 10 (1.778 m)   Wt 230 lb (104.3 kg)   SpO2 99%   BMI 33.00 kg/m  GEN: NAD EYE: Sclerae anicteric ENT: MMM CV: Non-tachycardic Pulm: CTA b/l GI: Soft, NT/ND NEURO:  Alert & Oriented x 3   Gordy Starch, MD [facility]  06/18/2024 11:27 AM

## 2024-06-18 NOTE — Progress Notes (Signed)
 Report to PACU, RN, vss, BBS= Clear.

## 2024-06-18 NOTE — Op Note (Signed)
 [facility] Patient Name: Antonio Doyle Procedure Date: 06/18/2024 11:31 AM MRN: [id number] Endoscopist: Gordy CHRISTELLA Starch , MD, [id number] Age: 43 Referring MD:  Date of Birth: 11/05/1980 Gender: Male Account #: [id number] Procedure:                Colonoscopy Indications:              Screening for colorectal malignant neoplasm, This                            is the patient's first colonoscopy Medicines:                Monitored Anesthesia Care Procedure:                Pre-Anesthesia Assessment:                           - Prior to the procedure, a History and Physical                            was performed, and patient medications and                            allergies were reviewed. The patient's tolerance of                            previous anesthesia was also reviewed. The risks                            and benefits of the procedure and the sedation                            options and risks were discussed with the patient.                            All questions were answered, and informed consent                            was obtained. Prior Anticoagulants: The patient has                            taken no anticoagulant or antiplatelet agents. ASA                            Grade Assessment: II - A patient with mild systemic                            disease. After reviewing the risks and benefits,                            the patient was deemed in satisfactory condition to                            undergo the procedure.  After obtaining informed consent, the colonoscope                            was passed under direct vision. Throughout the                            procedure, the patient's blood pressure, pulse, and                            oxygen saturations were monitored continuously. The                            Olympus Scope SN: [id number] was introduced through                            the anus and advanced to  the cecum, identified by                            appendiceal orifice and ileocecal valve. The                            colonoscopy was performed without difficulty. The                            patient tolerated the procedure well. The quality                            of the bowel preparation was good. The ileocecal                            valve, appendiceal orifice, and rectum were                            photographed. Scope In: 11:36:44 AM Scope Out: 11:52:34 AM Scope Withdrawal Time: 0 hours 11 minutes 47 seconds  Total Procedure Duration: 0 hours 15 minutes 50 seconds  Findings:                 The digital rectal exam was normal.                           Two sessile polyps were found in the transverse                            colon. The polyps were 3 to 6 mm in size. These                            polyps were removed with a cold snare. Resection                            and retrieval were complete.                           A few small-mouthed diverticula were found in the  sigmoid colon and descending colon.                           The retroflexed view of the distal rectum and anal                            verge was normal and showed no anal or rectal                            abnormalities. Complications:            No immediate complications. Estimated Blood Loss:     Estimated blood loss: none. Impression:               - Two 3 to 6 mm polyps in the transverse colon,                            removed with a cold snare. Resected and retrieved.                           - Mild diverticulosis in the sigmoid colon and in                            the descending colon.                           - The distal rectum and anal verge are normal on                            retroflexion view. Recommendation:           - Patient has a contact number available for                            emergencies. The signs and symptoms of  potential                            delayed complications were discussed with the                            patient. Return to normal activities tomorrow.                            Written discharge instructions were provided to the                            patient.                           - Resume previous diet.                           - Continue present medications.                           - Await pathology results.                           -  Repeat colonoscopy is recommended. The                            colonoscopy date will be determined after pathology                            results from today's exam become available for                            review. Gordy CHRISTELLA Starch, MD 06/18/2024 11:54:56 AM This report has been signed electronically.

## 2024-06-19 ENCOUNTER — Telehealth: Payer: Self-pay

## 2024-06-19 NOTE — Telephone Encounter (Signed)
 Left message on answering machine.

## 2024-06-21 LAB — SURGICAL PATHOLOGY

## 2024-07-03 ENCOUNTER — Ambulatory Visit: Payer: Self-pay | Admitting: Internal Medicine

## 2024-10-08 DIAGNOSIS — E039 Hypothyroidism, unspecified: Secondary | ICD-10-CM

## 2024-10-08 MED ORDER — LEVOTHYROXINE SODIUM 100 MCG PO TABS: 100.0000 ug | ORAL_TABLET | Freq: Every day | ORAL | 4 refills | Status: AC
